# Patient Record
Sex: Female | Born: 1960 | Race: White | Hispanic: No | Smoking: Former smoker
Health system: Southern US, Community
[De-identification: ages and names within clinical notes are randomized; demographics above are authoritative.]

## PROBLEM LIST (undated history)

## (undated) DIAGNOSIS — E785 Hyperlipidemia, unspecified: Secondary | ICD-10-CM

## (undated) DIAGNOSIS — Z8719 Personal history of other diseases of the digestive system: Secondary | ICD-10-CM

## (undated) DIAGNOSIS — M51369 Other intervertebral disc degeneration, lumbar region without mention of lumbar back pain or lower extremity pain: Secondary | ICD-10-CM

## (undated) DIAGNOSIS — G629 Polyneuropathy, unspecified: Secondary | ICD-10-CM

## (undated) DIAGNOSIS — F32A Depression, unspecified: Secondary | ICD-10-CM

## (undated) DIAGNOSIS — R51 Headache: Secondary | ICD-10-CM

## (undated) DIAGNOSIS — I639 Cerebral infarction, unspecified: Secondary | ICD-10-CM

## (undated) DIAGNOSIS — K219 Gastro-esophageal reflux disease without esophagitis: Secondary | ICD-10-CM

## (undated) DIAGNOSIS — T783XXA Angioneurotic edema, initial encounter: Secondary | ICD-10-CM

## (undated) DIAGNOSIS — E162 Hypoglycemia, unspecified: Secondary | ICD-10-CM

## (undated) DIAGNOSIS — IMO0002 Reserved for concepts with insufficient information to code with codable children: Secondary | ICD-10-CM

## (undated) DIAGNOSIS — I714 Abdominal aortic aneurysm, without rupture, unspecified: Secondary | ICD-10-CM

## (undated) DIAGNOSIS — R519 Headache, unspecified: Secondary | ICD-10-CM

## (undated) DIAGNOSIS — M5136 Other intervertebral disc degeneration, lumbar region: Secondary | ICD-10-CM

## (undated) DIAGNOSIS — Z91018 Allergy to other foods: Secondary | ICD-10-CM

## (undated) DIAGNOSIS — F329 Major depressive disorder, single episode, unspecified: Secondary | ICD-10-CM

## (undated) HISTORY — DX: Other intervertebral disc degeneration, lumbar region: M51.36

## (undated) HISTORY — DX: Hypoglycemia, unspecified: E16.2

## (undated) HISTORY — DX: Other intervertebral disc degeneration, lumbar region without mention of lumbar back pain or lower extremity pain: M51.369

## (undated) HISTORY — PX: COLONOSCOPY: SHX174

## (undated) HISTORY — DX: Angioneurotic edema, initial encounter: T78.3XXA

## (undated) HISTORY — DX: Gastro-esophageal reflux disease without esophagitis: K21.9

## (undated) HISTORY — DX: Reserved for concepts with insufficient information to code with codable children: IMO0002

## (undated) HISTORY — DX: Headache, unspecified: R51.9

## (undated) HISTORY — DX: Hyperlipidemia, unspecified: E78.5

## (undated) HISTORY — DX: Major depressive disorder, single episode, unspecified: F32.9

## (undated) HISTORY — DX: Headache: R51

## (undated) HISTORY — DX: Depression, unspecified: F32.A

---

## 2002-01-05 ENCOUNTER — Emergency Department (HOSPITAL_COMMUNITY): Admission: EM | Admit: 2002-01-05 | Discharge: 2002-01-05 | Payer: Self-pay

## 2004-07-17 ENCOUNTER — Ambulatory Visit: Payer: Self-pay | Admitting: Internal Medicine

## 2004-08-18 ENCOUNTER — Ambulatory Visit: Payer: Self-pay | Admitting: Internal Medicine

## 2004-08-28 ENCOUNTER — Ambulatory Visit: Payer: Self-pay | Admitting: Internal Medicine

## 2004-09-29 ENCOUNTER — Other Ambulatory Visit: Admission: RE | Admit: 2004-09-29 | Discharge: 2004-09-29 | Payer: Self-pay | Admitting: Obstetrics and Gynecology

## 2005-03-08 ENCOUNTER — Ambulatory Visit: Payer: Self-pay | Admitting: Internal Medicine

## 2005-03-11 ENCOUNTER — Encounter: Admission: RE | Admit: 2005-03-11 | Discharge: 2005-03-11 | Payer: Self-pay | Admitting: Internal Medicine

## 2005-10-19 ENCOUNTER — Other Ambulatory Visit: Admission: RE | Admit: 2005-10-19 | Discharge: 2005-10-19 | Payer: Self-pay | Admitting: Obstetrics and Gynecology

## 2006-10-11 ENCOUNTER — Other Ambulatory Visit: Admission: RE | Admit: 2006-10-11 | Discharge: 2006-10-11 | Payer: Self-pay | Admitting: Obstetrics and Gynecology

## 2006-10-25 ENCOUNTER — Ambulatory Visit: Payer: Self-pay | Admitting: Internal Medicine

## 2007-04-05 ENCOUNTER — Ambulatory Visit: Payer: Self-pay | Admitting: Internal Medicine

## 2007-06-13 DIAGNOSIS — K219 Gastro-esophageal reflux disease without esophagitis: Secondary | ICD-10-CM | POA: Insufficient documentation

## 2007-06-13 DIAGNOSIS — M722 Plantar fascial fibromatosis: Secondary | ICD-10-CM | POA: Insufficient documentation

## 2007-08-13 ENCOUNTER — Emergency Department: Payer: Self-pay | Admitting: Emergency Medicine

## 2007-11-24 ENCOUNTER — Telehealth: Payer: Self-pay | Admitting: Internal Medicine

## 2008-01-26 ENCOUNTER — Emergency Department (HOSPITAL_COMMUNITY): Admission: EM | Admit: 2008-01-26 | Discharge: 2008-01-27 | Payer: Self-pay | Admitting: Emergency Medicine

## 2008-12-17 ENCOUNTER — Telehealth: Payer: Self-pay | Admitting: Internal Medicine

## 2009-07-30 ENCOUNTER — Encounter (INDEPENDENT_AMBULATORY_CARE_PROVIDER_SITE_OTHER): Payer: Self-pay | Admitting: *Deleted

## 2009-12-14 ENCOUNTER — Emergency Department (HOSPITAL_COMMUNITY): Admission: EM | Admit: 2009-12-14 | Discharge: 2009-12-14 | Payer: Self-pay | Admitting: Family Medicine

## 2010-01-27 ENCOUNTER — Emergency Department (HOSPITAL_COMMUNITY): Admission: EM | Admit: 2010-01-27 | Discharge: 2010-01-27 | Payer: Self-pay | Admitting: Emergency Medicine

## 2010-03-10 ENCOUNTER — Emergency Department (HOSPITAL_COMMUNITY): Admission: EM | Admit: 2010-03-10 | Discharge: 2010-03-10 | Payer: Self-pay | Admitting: Emergency Medicine

## 2010-03-23 ENCOUNTER — Telehealth: Payer: Self-pay | Admitting: Internal Medicine

## 2010-03-29 ENCOUNTER — Emergency Department: Payer: Self-pay | Admitting: Internal Medicine

## 2010-08-30 ENCOUNTER — Encounter: Payer: Self-pay | Admitting: Obstetrics and Gynecology

## 2010-09-02 ENCOUNTER — Ambulatory Visit: Admit: 2010-09-02 | Payer: Self-pay | Admitting: Obstetrics & Gynecology

## 2010-09-10 NOTE — Progress Notes (Signed)
Summary: REFILL   Phone Note Refill Request Call back at Home Phone [phone]   Refills Requested: Medication #1:  AMITRIPTYLINE HCL 25 MG  TABS Take 1 tablet by mouth once a day  Medication #2:  DICLOFENAC SODIUM 75 MG  TBEC 1 two times a day as needed. Summary of Call: Patient is requesting refill. She can not afford office visit at this time.  Initial call taken by: Lamar Sprinkles,  November 24, 2007 10:42 AM  Follow-up for Phone Call        OK to refill: #30, refill as needed  Follow-up by: Jacques Navy MD,  November 24, 2007 1:06 PM    New/Updated Medications: DICLOFENAC SODIUM 75 MG  TBEC (DICLOFENAC SODIUM) 1 two times a day as needed   Prescriptions: AMITRIPTYLINE HCL 25 MG  TABS (AMITRIPTYLINE HCL) Take 1 tablet by mouth once a day  #30 x PRN   Entered by:   Lamar Sprinkles   Authorized by:   Jacques Navy MD   Signed by:   Lamar Sprinkles on 11/24/2007   Method used:   Telephoned to ...       [facility]       [address]       [place], [place]  [address]       Ph: [phone]       Fax: [phone]   RxID:   [id number] DICLOFENAC SODIUM 75 MG  TBEC (DICLOFENAC SODIUM) 1 two times a day as needed  #60 x PRN   Entered by:   Lamar Sprinkles   Authorized by:   Jacques Navy MD   Signed by:   Lamar Sprinkles on 11/24/2007   Method used:   Telephoned to ...       [facility]       [address]       [place], [place]  [address]       Ph: [phone]       Fax: [phone]   RxID:   [id number]

## 2010-09-10 NOTE — Progress Notes (Signed)
Summary: Schedule Colonoscopy  Phone Note Outgoing Call Call back at [facility] Phone [phone]   Call placed by: Harlow Mares CMA Duncan Dull),  March 23, 2010 11:32 AM Call placed to: Patient Summary of Call: Left message on patients machine to call back. patient is due for a colonoscopy Initial call taken by: Harlow Mares CMA Duncan Dull),  March 23, 2010 11:33 AM  Follow-up for Phone Call        Left a message on the patient machine to call back and schedule a previsit and procedure with our office. A letter will be mailed to the patient.   Follow-up by: Harlow Mares CMA Duncan Dull),  March 30, 2010 9:21 AM

## 2010-09-10 NOTE — Progress Notes (Signed)
Summary: refill  Phone Note Call from Patient Call back at Home Phone [phone] Message from:  Patient on Dec 17, 2008 2:46 PM  Caller: Patient Reason for Call: Refill Medication Summary of Call: Patient called stating that per pharmacy refills have been  denied. patient requesting call back/ last seen 2008 Initial call taken by: Rock Nephew CMA,  Dec 17, 2008 2:48 PM  Follow-up for Phone Call        Patient is requesting refill's on diclofenac and amitriptyline. Last office visit 2008 Follow-up by: Lamar Sprinkles,  Dec 18, 2008 5:04 PM  Additional Follow-up for Phone Call Additional follow up Details #1::        We refilled in '09. No ov since '08. May refill for 2 months to give time for OV Additional Follow-up by: Jacques Navy MD,  Dec 18, 2008 5:15 PM    New/Updated Medications: AMITRIPTYLINE HCL 25 MG  TABS (AMITRIPTYLINE HCL) Take 1 tablet by mouth once a day NOTE: NEEDS office visit within next 2 mths!!!!!!!!!!!!!!!!! DICLOFENAC SODIUM 75 MG  TBEC (DICLOFENAC SODIUM) 1 two times a day as needed NEEDS office visit IN NEXT 2 MTHS !!  !!!!!! !! ! ! !   Prescriptions: DICLOFENAC SODIUM 75 MG  TBEC (DICLOFENAC SODIUM) 1 two times a day as needed NEEDS office visit IN NEXT 2 MTHS !!  !!!!!! !! ! ! !  #60 x 1   Entered by:   Lamar Sprinkles   Authorized by:   Jacques Navy MD   Signed by:   Lamar Sprinkles on 12/19/2008   Method used:   Electronically to        [facility]. [phone]* (retail)       [address]       [place], [place]  [address]       Ph: [phone]       Fax: [phone]   RxID:   [id number] AMITRIPTYLINE HCL 25 MG  TABS (AMITRIPTYLINE HCL) Take 1 tablet by mouth once a day NOTE: NEEDS office visit within next 2 mths!!!!!!!!!!!!!!!!!  #30 x 1   Entered by:   Lamar Sprinkles   Authorized by:   Jacques Navy MD   Signed by:   Lamar Sprinkles on 12/19/2008   Method used:   Electronically to        [facility]. [phone]* (retail)       [address]       [place], [place]  [address]       Ph: [phone]       Fax: [phone]   RxID:   [id number]

## 2010-09-10 NOTE — Letter (Signed)
Summary: Colonoscopy Letter  [facility]  [address] [place], [place] [address]   Phone: [phone]  Fax: [phone]      July 30, 2009 MRN: [id number]   [facility] [address] [place], [place]  [address]   Dear Ms. Kathy Schaefer,   According to your medical record, it is time for you to schedule a Colonoscopy. The American Cancer Society recommends this procedure as a method to detect early colon cancer. Patients with a family history of colon cancer, or a personal history of colon polyps or inflammatory bowel disease are at increased risk.  This letter has beeen generated based on the recommendations made at the time of your procedure. If you feel that in your particular situation this may no longer apply, please contact our office.  Please call our office at [phone] to schedule this appointment or to update your records at your earliest convenience.  Thank you for cooperating with [place] to provide you with the very best care possible.   Sincerely,  Iva Boop, M.D.  [facility] [place] [facility] [phone]

## 2010-10-23 LAB — URINALYSIS, ROUTINE W REFLEX MICROSCOPIC
Bilirubin Urine: NEGATIVE
Glucose, UA: NEGATIVE mg/dL
Ketones, ur: NEGATIVE mg/dL
Leukocytes, UA: NEGATIVE
Nitrite: NEGATIVE
Protein, ur: NEGATIVE mg/dL
Specific Gravity, Urine: 1.012 (ref 1.005–1.030)
Urobilinogen, UA: 0.2 mg/dL (ref 0.0–1.0)
pH: 5 (ref 5.0–8.0)

## 2010-10-23 LAB — POCT I-STAT, CHEM 8
BUN: 6 mg/dL (ref 6–23)
Calcium, Ion: 0.97 mmol/L — ABNORMAL LOW (ref 1.12–1.32)
Chloride: 111 mEq/L (ref 96–112)
Creatinine, Ser: 0.9 mg/dL (ref 0.4–1.2)
Glucose, Bld: 111 mg/dL — ABNORMAL HIGH (ref 70–99)
HCT: 46 % (ref 36.0–46.0)
Hemoglobin: 15.6 g/dL — ABNORMAL HIGH (ref 12.0–15.0)
Potassium: 4 mEq/L (ref 3.5–5.1)
Sodium: 141 mEq/L (ref 135–145)
TCO2: 21 mmol/L (ref 0–100)

## 2010-10-23 LAB — ETHANOL: Alcohol, Ethyl (B): 165 mg/dL — ABNORMAL HIGH (ref 0–10)

## 2010-10-23 LAB — CBC
HCT: 44 % (ref 36.0–46.0)
Hemoglobin: 15.2 g/dL — ABNORMAL HIGH (ref 12.0–15.0)
MCH: 32.6 pg (ref 26.0–34.0)
MCHC: 34.5 g/dL (ref 30.0–36.0)
MCV: 94.4 fL (ref 78.0–100.0)
Platelets: 246 10*3/uL (ref 150–400)
RBC: 4.66 MIL/uL (ref 3.87–5.11)
RDW: 12 % (ref 11.5–15.5)
WBC: 8.7 10*3/uL (ref 4.0–10.5)

## 2010-10-23 LAB — URINE MICROSCOPIC-ADD ON

## 2010-10-23 LAB — COMPREHENSIVE METABOLIC PANEL
ALT: 21 U/L (ref 0–35)
AST: 36 U/L (ref 0–37)
Albumin: 4.4 g/dL (ref 3.5–5.2)
Alkaline Phosphatase: 103 U/L (ref 39–117)
BUN: 7 mg/dL (ref 6–23)
CO2: 22 mEq/L (ref 19–32)
Calcium: 9 mg/dL (ref 8.4–10.5)
Chloride: 109 mEq/L (ref 96–112)
Creatinine, Ser: 0.71 mg/dL (ref 0.4–1.2)
GFR calc Af Amer: >60 mL/min (ref >60)
GFR calc non Af Amer: >60 mL/min (ref >60)
Glucose, Bld: 117 mg/dL — ABNORMAL HIGH (ref 70–99)
Potassium: 3.9 mEq/L (ref 3.5–5.1)
Sodium: 139 mEq/L (ref 135–145)
Total Bilirubin: 0.3 mg/dL (ref 0.3–1.2)
Total Protein: 7.5 g/dL (ref 6.0–8.3)

## 2010-10-23 LAB — APTT: aPTT: 22 seconds — ABNORMAL LOW (ref 24–37)

## 2010-10-23 LAB — PROTIME-INR
INR: 0.87 (ref 0.00–1.49)
Prothrombin Time: 11.8 seconds (ref 11.6–15.2)

## 2010-10-23 LAB — POCT PREGNANCY, URINE: Preg Test, Ur: NEGATIVE

## 2010-10-23 LAB — LACTIC ACID, PLASMA: Lactic Acid, Venous: 3.3 mmol/L — ABNORMAL HIGH (ref 0.5–2.2)

## 2010-10-25 LAB — BASIC METABOLIC PANEL
BUN: 12 mg/dL (ref 6–23)
CO2: 26 mEq/L (ref 19–32)
Calcium: 9 mg/dL (ref 8.4–10.5)
Chloride: 106 mEq/L (ref 96–112)
Creatinine, Ser: 0.73 mg/dL (ref 0.4–1.2)
GFR calc Af Amer: >60 mL/min (ref >60)
GFR calc non Af Amer: >60 mL/min (ref >60)
Glucose, Bld: 105 mg/dL — ABNORMAL HIGH (ref 70–99)
Potassium: 4.2 mEq/L (ref 3.5–5.1)
Sodium: 137 mEq/L (ref 135–145)

## 2010-10-25 LAB — CBC
HCT: 35.7 % — ABNORMAL LOW (ref 36.0–46.0)
Hemoglobin: 12.6 g/dL (ref 12.0–15.0)
MCHC: 35.2 g/dL (ref 30.0–36.0)
MCV: 95.8 fL (ref 78.0–100.0)
Platelets: 224 10*3/uL (ref 150–400)
RBC: 3.73 MIL/uL — ABNORMAL LOW (ref 3.87–5.11)
RDW: 12.5 % (ref 11.5–15.5)
WBC: 5.6 10*3/uL (ref 4.0–10.5)

## 2010-10-25 LAB — URINALYSIS, ROUTINE W REFLEX MICROSCOPIC
Bilirubin Urine: NEGATIVE
Glucose, UA: NEGATIVE mg/dL
Hgb urine dipstick: NEGATIVE
Ketones, ur: NEGATIVE mg/dL
Nitrite: NEGATIVE
Protein, ur: NEGATIVE mg/dL
Specific Gravity, Urine: 1.021 (ref 1.005–1.030)
Urobilinogen, UA: 0.2 mg/dL (ref 0.0–1.0)
pH: 5.5 (ref 5.0–8.0)

## 2010-10-25 LAB — DIFFERENTIAL
Basophils Absolute: 0 10*3/uL (ref 0.0–0.1)
Basophils Relative: 1 % (ref 0–1)
Eosinophils Absolute: 0.1 10*3/uL (ref 0.0–0.7)
Eosinophils Relative: 1 % (ref 0–5)
Lymphocytes Relative: 37 % (ref 12–46)
Lymphs Abs: 2.1 10*3/uL (ref 0.7–4.0)
Monocytes Absolute: 0.4 10*3/uL (ref 0.1–1.0)
Monocytes Relative: 7 % (ref 3–12)
Neutro Abs: 3 10*3/uL (ref 1.7–7.7)
Neutrophils Relative %: 54 % (ref 43–77)

## 2010-10-25 LAB — POCT CARDIAC MARKERS
CKMB, poc: <1 ng/mL — ABNORMAL LOW (ref 1.0–8.0)
Myoglobin, poc: 29.3 ng/mL (ref 12–200)
Troponin i, poc: <0.05 ng/mL (ref 0.00–0.09)

## 2010-10-25 LAB — POCT PREGNANCY, URINE: Preg Test, Ur: NEGATIVE

## 2010-11-14 ENCOUNTER — Emergency Department: Payer: Self-pay | Admitting: Emergency Medicine

## 2010-12-25 NOTE — Assessment & Plan Note (Signed)
[facility]                           PRIMARY CARE OFFICE NOTE   ZULEIKA, GALLUS                 MRN:          [id number]  DATE:10/26/2006                            DOB:          March 15, 1961    Ms. Kathy Schaefer is a 50 year old woman last seen in the office March 08, 2005, for sudden visual change.  Her evaluation at that time was  unremarkable with a negative MRI/MRA.  In the interval, she reports she  has been doing well.  She continues to use diclofenac on a daily basis.  She uses Nexium p.r.n. and is trying Prilosec at this time.  She reports  she does see her gynecologist, last visit being March 2008.  A mammogram  was scheduled.  She last had a colonoscopy in January 2006.   Past medical history is well documented, July 17, 2004, with no  significant change.  Family history remains the same.   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  She has a daughter who is out of school, a  daughter who is finishing her training in Financial controller.  The  patient does report she is in a relationship.   REVIEW OF SYSTEMS:  The patient has had no constitutional problems.  No  ENT, cardiovascular, respiratory, GI problems.   LIMITED EXAM:  VITAL SIGNS:  Temperature was 98.8, blood pressure  124/77, pulse 82, weight 203.  GENERAL APPEARANCE:  This is a heavy-set Caucasian female, looks her  stated age, in no acute distress.  HEENT:  Normocephalic, atraumatic.  EACs and TMs were normal.  Oropharynx revealed a full upper denture.  Native dentition on the  mandible.  No buccal lesions were noted.  Posterior pharynx was clear.  Conjunctivae and sclerae was clear.  PERRLA.  EOMI.  Funduscopic exam  was unremarkable.  NECK:  Supple without thyromegaly.  NODES:  No adenopathy was noted in the cervical, supraclavicular  regions.  CHEST:  No CVA tenderness.  LUNGS:  Clear to auscultation and  percussion.  BREAST EXAM:  Deferred.  ABDOMEN:  Soft, no guarding or rebound.  No organ or splenomegaly was  noted.  PELVIC/RECTAL:  Deferred.  EXTREMITIES:  Without cyanosis, clubbing, edema, or deformity.  She has  full range of motion of her neck with no limitation.   ASSESSMENT/PLAN:  1. Degenerative joint disease.  The patient with ongoing neck pain and      discomfort for which she takes Voltaren with good result.  Refill      prescriptions provided.  2. Gastroesophageal reflux disease.  The patient has intermittent      gastroesophageal reflux disease, currently using Prilosec with good      results.  I have advised her to increase the dose from 20 to 40 mg      daily for comparable effects of Nexium.  3. Health maintenance.  The patient is current and up to date.   The patient is asked to return to see me in 12 to 18 months for routine  followup.  I have asked her to provide me  with copies of labs that were  done when she had a [facility] physical.     Rosalyn Gess. Norins, MD  Electronically Signed    MEN/MedQ  DD: 10/26/2006  DT: 10/26/2006  Job #: [id number]   cc:   [address] [place] [address] [place], [phone] [place]

## 2011-11-01 ENCOUNTER — Encounter: Payer: Self-pay | Admitting: Internal Medicine

## 2013-03-27 ENCOUNTER — Ambulatory Visit: Payer: Self-pay | Attending: Internal Medicine

## 2013-03-27 ENCOUNTER — Ambulatory Visit: Payer: No Typology Code available for payment source | Attending: Internal Medicine | Admitting: Internal Medicine

## 2013-03-27 VITALS — BP 129/82 | HR 99 | Temp 98.3°F | Resp 16 | Wt 234.0 lb

## 2013-03-27 DIAGNOSIS — F32A Depression, unspecified: Secondary | ICD-10-CM

## 2013-03-27 DIAGNOSIS — F3289 Other specified depressive episodes: Secondary | ICD-10-CM

## 2013-03-27 DIAGNOSIS — F329 Major depressive disorder, single episode, unspecified: Secondary | ICD-10-CM | POA: Insufficient documentation

## 2013-03-27 DIAGNOSIS — M1990 Unspecified osteoarthritis, unspecified site: Secondary | ICD-10-CM

## 2013-03-27 DIAGNOSIS — E785 Hyperlipidemia, unspecified: Secondary | ICD-10-CM

## 2013-03-27 LAB — CBC WITH DIFFERENTIAL/PLATELET
Basophils Absolute: 0 10*3/uL (ref 0.0–0.1)
Basophils Relative: 0 % (ref 0–1)
Eosinophils Absolute: 0.1 10*3/uL (ref 0.0–0.7)
Eosinophils Relative: 2 % (ref 0–5)
HCT: 39.1 % (ref 36.0–46.0)
Hemoglobin: 13.4 g/dL (ref 12.0–15.0)
Lymphocytes Relative: 42 % (ref 12–46)
Lymphs Abs: 2.2 10*3/uL (ref 0.7–4.0)
MCH: 30.2 pg (ref 26.0–34.0)
MCHC: 34.3 g/dL (ref 30.0–36.0)
MCV: 88.1 fL (ref 78.0–100.0)
Monocytes Absolute: 0.4 10*3/uL (ref 0.1–1.0)
Monocytes Relative: 8 % (ref 3–12)
Neutro Abs: 2.6 10*3/uL (ref 1.7–7.7)
Neutrophils Relative %: 48 % (ref 43–77)
Platelets: 250 10*3/uL (ref 150–400)
RBC: 4.44 MIL/uL (ref 3.87–5.11)
RDW: 13 % (ref 11.5–15.5)
WBC: 5.4 10*3/uL (ref 4.0–10.5)

## 2013-03-27 MED ORDER — AMITRIPTYLINE HCL 10 MG PO TABS: 10.0000 mg | ORAL_TABLET | Freq: Every day | ORAL | Status: DC

## 2013-03-27 MED ORDER — TRAMADOL HCL 50 MG PO TABS: 50.0000 mg | ORAL_TABLET | Freq: Three times a day (TID) | ORAL | Status: DC | PRN

## 2013-03-27 NOTE — Progress Notes (Signed)
Patient ID: Kathy Schaefer, female   DOB: [birth year], 52 y.o.   MRN: [id number]  CC: To establish care  HPI: Patient is a 53 years old woman presented to our clinic today to establish medical care. She complains of generalized joint pain, she claims she was diagnosed with osteoarthritis and depression, has been on diclofenac for pain for so long that is it starts to affect her kidney according to the previous doctor. She also claimed to be taking over-the-counter ibuprofen, and has been advised never to take his medications again because of her kidney. The pain persisted however, she has not had MRI of her neck of the lumbar spine. She does not have any weakness, no urinary or fecal incontinence, no seizure. She has no history of hypertension or diabetes. She's not on any chronic medication is a for pain. She claims she is depressed at the moment, but denies any suicidal ideation or thoughts, she also declined been prescribed medication. She denied any injury. She says she has been worked up for rheumatoid arthritis and other autoimmune disease, all results were negative according to patient.  Allergies  Allergen Reactions  . Cyclobenzaprine Hcl     REACTION: Headache  . Guaifenesin   . Phenylpropanolamine Hcl    Past Medical History  Diagnosis Date  . Head ache   . Degenerative disc disease    Current Outpatient Prescriptions on File Prior to Visit  Medication Sig Dispense Refill  . amitriptyline (ELAVIL) 10 MG tablet Take 10 mg by mouth at bedtime.      . diclofenac (VOLTAREN) 75 MG EC tablet Take 75 mg by mouth 2 (two) times daily.       No current facility-administered medications on file prior to visit.   History reviewed. No pertinent family history. History   Social History  . Marital Status: Legally Separated    Spouse Name: N/A    Number of Children: N/A  . Years of Education: N/A   Occupational History  . Not on file.   Social History Main Topics  . Smoking status: Not  on file  . Smokeless tobacco: Not on file  . Alcohol Use:   . Drug Use:   . Sexual Activity:    Other Topics Concern  . Not on file   Social History Narrative  . No narrative on file    Review of Systems: Constitutional: Negative for fever, chills, diaphoresis, activity change, appetite change and fatigue. HENT: Negative for ear pain, nosebleeds, congestion, facial swelling, rhinorrhea, neck pain, neck stiffness and ear discharge.  Eyes: Negative for pain, discharge, redness, itching and visual disturbance. Respiratory: Negative for cough, choking, chest tightness, shortness of breath, wheezing and stridor.  Cardiovascular: Negative for chest pain, palpitations and leg swelling. Gastrointestinal: Negative for abdominal distention. Genitourinary: Negative for dysuria, urgency, frequency, hematuria, flank pain, decreased urine volume, difficulty urinating and dyspareunia.  Musculoskeletal: Genet=ralized joint pains Neurological: Negative for dizziness, tremors, seizures, syncope, facial asymmetry, speech difficulty, weakness, light-headedness, numbness and headaches.  Hematological: Negative for adenopathy. Does not bruise/bleed easily. Psychiatric/Behavioral: Negative for hallucinations, behavioral problems, confusion, dysphoric mood, decreased concentration and agitation.    Objective:   Filed Vitals:   03/27/13 1532  BP: 129/82  Pulse: 99  Temp: 98.3 F (36.8 C)  Resp: 16    Physical Exam: Constitutional: Patient appears well-developed and well-nourished. No distress. HENT: Normocephalic, atraumatic, External right and left ear normal. Oropharynx is clear and moist.  Eyes: Conjunctivae and EOM are normal. PERRLA, no scleral  icterus. Neck: Point tenderness at the cervical spine CVS: RRR, S1/S2 +, no murmurs, no gallops, no carotid bruit.  Pulmonary: Effort and breath sounds normal, no stridor, rhonchi, wheezes, rales.  Abdominal: Soft. BS +,  no distension, tenderness,  rebound or guarding.  Musculoskeletal: Normal range of motion. No edema and no tenderness.  Lymphadenopathy: No lymphadenopathy noted, cervical, inguinal or axillary Neuro: Alert. Normal reflexes, muscle tone coordination. No cranial nerve deficit. Skin: Skin is warm and dry. No rash noted. Not diaphoretic. No erythema. No pallor. Psychiatric: Normal mood and affect. Behavior, judgment, thought content normal.  Lab Results  Component Value Date   WBC 8.7 03/10/2010   HGB 15.6* 03/10/2010   HCT 46.0 03/10/2010   MCV 94.4 03/10/2010   PLT 246 03/10/2010   Lab Results  Component Value Date   CREATININE 0.9 03/10/2010   BUN 6 03/10/2010   NA 141 03/10/2010   K 4.0 03/10/2010   CL 111 03/10/2010   CO2 22 03/10/2010    No results found for this basename: HGBA1C   Lipid Panel  No results found for this basename: chol, trig, hdl, cholhdl, vldl, ldlcalc       Assessment and plan:   Patient Active Problem List   Diagnosis Date Noted  . Osteoarthritis 03/27/2013  . Depression (emotion) 03/27/2013  . GERD 06/13/2007  . 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  Jolee Ewing verbalized understanding.  HAYDYN LIDDELL was told to call to get lab results if hasn't heard anything in the next week.        Jeanann Lewandowsky, MD [facility] [place], [place] [phone]   03/27/2013, 4:20 PM

## 2013-03-27 NOTE — Progress Notes (Signed)
Patient here to establish care Was diagnosed with spondylosis Aches all over

## 2013-03-28 ENCOUNTER — Encounter: Payer: Self-pay | Admitting: Internal Medicine

## 2013-03-28 LAB — LIPID PANEL
Cholesterol: 306 mg/dL — ABNORMAL HIGH (ref 0–200)
HDL: 37 mg/dL — ABNORMAL LOW (ref >39)
Total CHOL/HDL Ratio: 8.3 Ratio
Triglycerides: 514 mg/dL — ABNORMAL HIGH (ref <150)

## 2013-03-28 LAB — COMPLETE METABOLIC PANEL WITH GFR
ALT: 9 U/L (ref 0–35)
AST: 15 U/L (ref 0–37)
Albumin: 4.3 g/dL (ref 3.5–5.2)
Alkaline Phosphatase: 135 U/L — ABNORMAL HIGH (ref 39–117)
BUN: 15 mg/dL (ref 6–23)
CO2: 33 mEq/L — ABNORMAL HIGH (ref 19–32)
Calcium: 9.8 mg/dL (ref 8.4–10.5)
Chloride: 101 mEq/L (ref 96–112)
Creat: 0.71 mg/dL (ref 0.50–1.10)
GFR, Est African American: >89 mL/min
GFR, Est Non African American: >89 mL/min
Glucose, Bld: 88 mg/dL (ref 70–99)
Potassium: 4.4 mEq/L (ref 3.5–5.3)
Sodium: 141 mEq/L (ref 135–145)
Total Bilirubin: 0.3 mg/dL (ref 0.3–1.2)
Total Protein: 6.9 g/dL (ref 6.0–8.3)

## 2013-03-28 LAB — CK: Total CK: 73 U/L (ref 7–177)

## 2013-03-28 NOTE — Progress Notes (Unsigned)
CSW met with patient in order to provide emotional support.  Patient stated that she has been dealing with chronic pain and has been very tearful due to intensity of pain and lack of independent lifestyle that she was used to.  CSW encouraged patient to connect to local community supports to gain resources.  CSW provided patient with some reframing to relieve low mood state.  Patient was not willing to identify depression as a diagnosis.  CSW identified better understanding that patient's mood state is connected to her how she feels and her recent lifestyle change, but monitoring is important for ongoing care and management to ensure mood does not worsen.  CSW will continue to follow as necessary. Beverly Sessions MSW, LCSW [phone] Duration - 45 min

## 2013-03-29 LAB — TSH+FREE T4
Free T4: 1.12 ng/dL (ref 0.82–1.77)
TSH: 0.922 u[IU]/mL (ref 0.450–4.500)

## 2013-04-02 ENCOUNTER — Other Ambulatory Visit (HOSPITAL_COMMUNITY): Payer: Self-pay

## 2013-04-03 ENCOUNTER — Ambulatory Visit
Admission: RE | Admit: 2013-04-03 | Discharge: 2013-04-03 | Disposition: A | Discharge status: Home / Self Care | Payer: No Typology Code available for payment source | Source: Ambulatory Visit | Attending: Internal Medicine | Admitting: Internal Medicine

## 2013-04-03 ENCOUNTER — Other Ambulatory Visit: Payer: Self-pay | Admitting: Internal Medicine

## 2013-04-03 DIAGNOSIS — M1990 Unspecified osteoarthritis, unspecified site: Secondary | ICD-10-CM

## 2013-04-04 ENCOUNTER — Ambulatory Visit: Payer: Self-pay

## 2013-04-06 ENCOUNTER — Telehealth: Payer: Self-pay | Admitting: Emergency Medicine

## 2013-04-06 NOTE — Telephone Encounter (Signed)
Message copied by Darlis Loan on Fri Apr 06, 2013  4:46 PM ------      Message from: Jeanann Lewandowsky E      Created: Wed Apr 04, 2013  5:10 PM       Please let patient know that based on her MRI reports, she will need to be seen by neurosurgery. We can schedule a referral for her to see neurosurgery if it has not been done before. Please call to make appointment for this referral ------

## 2013-04-06 NOTE — Telephone Encounter (Signed)
Left a message with pt to schedule appt for neurology referral

## 2013-04-12 ENCOUNTER — Ambulatory Visit: Payer: No Typology Code available for payment source | Attending: Internal Medicine | Admitting: Internal Medicine

## 2013-04-12 ENCOUNTER — Encounter: Payer: Self-pay | Admitting: Internal Medicine

## 2013-04-12 VITALS — BP 126/80 | HR 81 | Temp 98.7°F | Resp 16 | Ht 66.93 in | Wt 231.0 lb

## 2013-04-12 DIAGNOSIS — M1990 Unspecified osteoarthritis, unspecified site: Secondary | ICD-10-CM

## 2013-04-12 DIAGNOSIS — E781 Pure hyperglyceridemia: Secondary | ICD-10-CM

## 2013-04-12 DIAGNOSIS — M549 Dorsalgia, unspecified: Secondary | ICD-10-CM | POA: Insufficient documentation

## 2013-04-12 DIAGNOSIS — E785 Hyperlipidemia, unspecified: Secondary | ICD-10-CM | POA: Insufficient documentation

## 2013-04-12 MED ORDER — FENOFIBRATE 145 MG PO TABS: 145.0000 mg | ORAL_TABLET | Freq: Every day | ORAL | Status: DC

## 2013-04-12 MED ORDER — AMITRIPTYLINE HCL 25 MG PO TABS: 25.0000 mg | ORAL_TABLET | Freq: Every day | ORAL | Status: DC

## 2013-04-12 MED ORDER — TRAMADOL HCL 50 MG PO TABS: 50.0000 mg | ORAL_TABLET | Freq: Three times a day (TID) | ORAL | Status: DC | PRN

## 2013-04-12 NOTE — Progress Notes (Signed)
F/U pt is here for MRI and lab results.

## 2013-04-12 NOTE — Progress Notes (Signed)
Patient ID: Kathy Schaefer, female   DOB: 1961/05/30, 52 y.o.   MRN: [id number]   History of present illness 52 year old female who was seen recently in the clinic for symptoms of ongoing back pain and pain over almost every joint in the body had an MRI of the cervical and lumbar spine done pediatrician here for followup of results. MRI of the cervical spine showed disc protrusions with bilateral foraminal stenosis. Mild the lumbar spine showed severe to verify pacer degeneration with some marrow edema without nerve impingement . No Spinal stenosis noted. She reports ongoing pain. Denies any tingling or numbness or muscle weakness. Denies any bowel or urinary symptoms. Denies chest pain, palpitations, shortness of breath, headache, blurred vision, dizziness, fever, chills, bowel or urinary symptoms.  Objective:  Vital signs in last 24 hours:  Filed Vitals:   04/12/13 1807  BP: 126/80  Pulse: 81  Temp: 98.7 F (37.1 C)  TempSrc: Oral  Resp: 16  Height: 5' 6.93" (1.7 m)  Weight: 231 lb (104.781 kg)  SpO2: 99%     Physical Exam:  General: Middle aged female in no acute distress. HEENT: no pallor, no icterus, moist oral mucosa,  Heart: Normal  s1 &s2  Regular rate and rhythm,  Lungs: Clear to auscultation bilaterally. Abdomen: Soft, nontender, nondistended, positive bowel sounds. Extremities: No back tenderness, SLT negative, normal range of motion of the joints, No clubbing cyanosis or edema with positive pedal pulses. Neuro: Alert, awake, oriented x3, nonfocal.   Lab Results:  Basic Metabolic Panel:    Component Value Date/Time   NA 141 03/27/2013 1623   K 4.4 03/27/2013 1623   CL 101 03/27/2013 1623   CO2 33* 03/27/2013 1623   BUN 15 03/27/2013 1623   CREATININE 0.71 03/27/2013 1623   CREATININE 0.9 03/10/2010 0151   GLUCOSE 88 03/27/2013 1623   CALCIUM 9.8 03/27/2013 1623   CBC:    Component Value Date/Time   WBC 5.4 03/27/2013 1623   HGB 13.4 03/27/2013 1623   HCT 39.1  03/27/2013 1623   PLT 250 03/27/2013 1623   MCV 88.1 03/27/2013 1623   NEUTROABS 2.6 03/27/2013 1623   LYMPHSABS 2.2 03/27/2013 1623   MONOABS 0.4 03/27/2013 1623   EOSABS 0.1 03/27/2013 1623   BASOSABS 0.0 03/27/2013 1623    No results found for this or any previous visit (from the past 240 hour(s)).  Studies/Results: No results found.  Medications: Scheduled Meds: Continuous Infusions: PRN Meds:.    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 No nerve impingement. Patient has ongoing back pain. I would continue her on tramadol when necessary and amitriptyline at bedtime. We'll increase the amitriptyline as it seems to help with her symptoms. Watercooled neurosurgery.  Dyslipidemia with hypertriglyceridemia Will start her on tricor 145 mg daily   Kathy Schaefer 04/12/2013, 6:50 PM

## 2013-04-24 ENCOUNTER — Telehealth: Payer: Self-pay | Admitting: Emergency Medicine

## 2013-04-24 NOTE — Telephone Encounter (Signed)
Spoke with Dr. Hyman Hopes regarding pt concerns of taking prescribed Diclofenac 75 mg for degenerative disease. Pt states she was told per to stop taking med due to kidney damage. Pt to continue taking medication until seen by orthopedic per Dr. Hyman Hopes.

## 2013-04-26 ENCOUNTER — Other Ambulatory Visit: Payer: Self-pay | Admitting: Emergency Medicine

## 2013-04-26 MED ORDER — DICLOFENAC SODIUM 75 MG PO TBEC: 75.0000 mg | DELAYED_RELEASE_TABLET | Freq: Two times a day (BID) | ORAL | Status: DC

## 2013-05-03 ENCOUNTER — Ambulatory Visit: Payer: Self-pay

## 2013-05-18 ENCOUNTER — Other Ambulatory Visit: Payer: Self-pay | Admitting: Emergency Medicine

## 2013-05-18 ENCOUNTER — Telehealth: Payer: Self-pay | Admitting: Emergency Medicine

## 2013-05-18 ENCOUNTER — Other Ambulatory Visit: Payer: Self-pay | Admitting: Internal Medicine

## 2013-05-18 MED ORDER — AMITRIPTYLINE HCL 25 MG PO TABS: 25.0000 mg | ORAL_TABLET | Freq: Every day | ORAL | Status: DC

## 2013-07-18 ENCOUNTER — Other Ambulatory Visit: Payer: Self-pay | Admitting: Internal Medicine

## 2013-08-07 ENCOUNTER — Other Ambulatory Visit: Payer: Self-pay | Admitting: Internal Medicine

## 2013-08-08 ENCOUNTER — Telehealth: Payer: Self-pay | Admitting: Emergency Medicine

## 2013-08-08 ENCOUNTER — Other Ambulatory Visit: Payer: Self-pay | Admitting: Emergency Medicine

## 2013-08-08 MED ORDER — DICLOFENAC SODIUM 75 MG PO TBEC: 75.0000 mg | DELAYED_RELEASE_TABLET | Freq: Two times a day (BID) | ORAL | Status: DC

## 2013-08-08 NOTE — Telephone Encounter (Signed)
Pt called in requesting refill Diclofenac 75 mg tab Pt informed this will be the last time we fill medication without office visit #30 day supply given,no refill

## 2013-08-17 ENCOUNTER — Encounter: Payer: Self-pay | Admitting: Internal Medicine

## 2013-08-17 ENCOUNTER — Ambulatory Visit: Payer: Self-pay | Attending: Internal Medicine | Admitting: Internal Medicine

## 2013-08-17 VITALS — BP 130/84 | HR 70 | Temp 98.5°F | Resp 16 | Wt 244.0 lb

## 2013-08-17 DIAGNOSIS — M1990 Unspecified osteoarthritis, unspecified site: Secondary | ICD-10-CM

## 2013-08-17 MED ORDER — DICLOFENAC SODIUM 75 MG PO TBEC
75.0000 mg | DELAYED_RELEASE_TABLET | Freq: Two times a day (BID) | ORAL | Status: DC
Start: 2013-08-17 — End: 2013-11-15

## 2013-08-17 MED ORDER — TRAMADOL HCL 50 MG PO TABS: 50.0000 mg | ORAL_TABLET | Freq: Three times a day (TID) | ORAL | Status: DC | PRN

## 2013-08-17 MED ORDER — AMITRIPTYLINE HCL 25 MG PO TABS: ORAL_TABLET | ORAL | Status: DC

## 2013-08-17 NOTE — Progress Notes (Signed)
Patient is here for medication refill Also would like to discuss  Her MRI of her spine results In confused as what she was told- not sure if she should be going To orthopedic surgeon or neuro surgeon

## 2013-08-17 NOTE — Progress Notes (Signed)
MRN: [id number] Name: Kathy Schaefer  Sex: female Age: 53 y.o. DOB: 02/29/1961  Allergies: Cyclobenzaprine hcl; Guaifenesin; and Phenylpropanolamine hcl  Chief Complaint  Patient presents with  . Back Pain    HPI: Patient is 53 y.o. female who comes today and is requesting refill on her medication history of chronic neck pain low back pain history of past arthritis, had MRI cervical and lower lumbar done in August and was recommended to follow with neurosurgeon. Denies any worsening of the symptoms denies any fever chills. She takes Voltaren for the pain and tramadol for severe pain when necessary.  Past Medical History  Diagnosis Date  . Head ache   . Degenerative disc disease   . GERD (gastroesophageal reflux disease)   . Depression     History reviewed. No pertinent past surgical history.    Medication List       This list is accurate as of: 08/17/13  4:06 PM.  Always use your most recent med list.               amitriptyline 25 MG tablet  Commonly known as:  ELAVIL  TAKE ONE TABLET BY MOUTH AT BEDTIME     diclofenac 75 MG EC tablet  Commonly known as:  VOLTAREN  Take 1 tablet (75 mg total) by mouth 2 (two) times daily.     fenofibrate 145 MG tablet  Commonly known as:  TRICOR  Take 1 tablet (145 mg total) by mouth daily.     traMADol 50 MG tablet  Commonly known as:  ULTRAM  Take 1 tablet (50 mg total) by mouth every 8 (eight) hours as needed.        Meds ordered this encounter  Medications  . amitriptyline (ELAVIL) 25 MG tablet    Sig: TAKE ONE TABLET BY MOUTH AT BEDTIME    Dispense:  30 tablet    Refill:  3  . diclofenac (VOLTAREN) 75 MG EC tablet    Sig: Take 1 tablet (75 mg total) by mouth 2 (two) times daily.    Dispense:  60 tablet    Refill:  3  . traMADol (ULTRAM) 50 MG tablet    Sig: Take 1 tablet (50 mg total) by mouth every 8 (eight) hours as needed.    Dispense:  30 tablet    Refill:  0     There is no immunization history on file for  this patient.  History reviewed. No pertinent family history.  History  Substance Use Topics  . Smoking status: Never Smoker   . Smokeless tobacco: Not on file  . Alcohol Use: 0.5 oz/week    1 drink(s) per week    Review of Systems  As noted in HPI  Filed Vitals:   08/17/13 1444  BP: 130/84  Pulse: 70  Temp: 98.5 F (36.9 C)  Resp: 16    Physical Exam  Physical Exam  Cardiovascular: Normal rate and regular rhythm.   Pulmonary/Chest: Breath sounds normal. No respiratory distress. She has no wheezes. She has no rales.  Musculoskeletal:  Minimal lower lumbar paraspinal tenderness, with SLR test patient complaining of back discomfort denies any shooting pain down to the leg, equal strength all extremities.  Neurological: She has normal reflexes.    CBC    Component Value Date/Time   WBC 5.4 03/27/2013 1623   RBC 4.44 03/27/2013 1623   HGB 13.4 03/27/2013 1623   HCT 39.1 03/27/2013 1623   PLT 250 03/27/2013 1623   MCV  88.1 03/27/2013 1623   LYMPHSABS 2.2 03/27/2013 1623   MONOABS 0.4 03/27/2013 1623   EOSABS 0.1 03/27/2013 1623   BASOSABS 0.0 03/27/2013 1623    CMP     Component Value Date/Time   NA 141 03/27/2013 1623   K 4.4 03/27/2013 1623   CL 101 03/27/2013 1623   CO2 33* 03/27/2013 1623   GLUCOSE 88 03/27/2013 1623   BUN 15 03/27/2013 1623   CREATININE 0.71 03/27/2013 1623   CREATININE 0.9 03/10/2010 0151   CALCIUM 9.8 03/27/2013 1623   PROT 6.9 03/27/2013 1623   ALBUMIN 4.3 03/27/2013 1623   AST 15 03/27/2013 1623   ALT 9 03/27/2013 1623   ALKPHOS 135* 03/27/2013 1623   BILITOT 0.3 03/27/2013 1623   GFRNONAA >60 03/10/2010 0142   GFRAA  Value: >60        The eGFR has been calculated using the MDRD equation. This calculation has not been validated in all clinical situations. eGFR's persistently <60 mL/min signify possible Chronic Kidney Disease. 03/10/2010 0142    Lab Results  Component Value Date/Time   CHOL 306* 03/27/2013  4:23 PM    No components found with this  basename: hga1c    Lab Results  Component Value Date/Time   AST 15 03/27/2013  4:23 PM    Assessment and Plan  Osteoarthritis - Plan: Patient was given refill on the medications diclofenac (VOLTAREN) 75 MG EC tablet, traMADol (ULTRAM) 50 MG tablet, also advise to make appointment with neurosurgery.   Return in about 3 months (around 11/15/2013).  Lorayne Marek, MD

## 2013-08-20 ENCOUNTER — Telehealth: Payer: Self-pay | Admitting: Internal Medicine

## 2013-08-20 NOTE — Telephone Encounter (Signed)
Left message for pt to call clinic when message received 

## 2013-08-20 NOTE — Telephone Encounter (Signed)
Pt called in to day to check on the status of her refills for scripts diclofenac (VOLTAREN) 75 MG EC tablet and amitriptyline (ELAVIL) 25 MG tablet; pt was informed that she still has refills and to call her pharmacy to check the readiness;

## 2013-09-13 ENCOUNTER — Ambulatory Visit: Payer: Self-pay | Admitting: Internal Medicine

## 2013-11-15 ENCOUNTER — Encounter: Payer: Self-pay | Admitting: Internal Medicine

## 2013-11-15 ENCOUNTER — Ambulatory Visit: Payer: No Typology Code available for payment source | Attending: Internal Medicine | Admitting: Internal Medicine

## 2013-11-15 VITALS — BP 136/84 | HR 84 | Temp 98.2°F | Resp 18 | Wt 242.8 lb

## 2013-11-15 DIAGNOSIS — M1990 Unspecified osteoarthritis, unspecified site: Secondary | ICD-10-CM

## 2013-11-15 DIAGNOSIS — Z139 Encounter for screening, unspecified: Secondary | ICD-10-CM

## 2013-11-15 DIAGNOSIS — E781 Pure hyperglyceridemia: Secondary | ICD-10-CM

## 2013-11-15 DIAGNOSIS — IMO0002 Reserved for concepts with insufficient information to code with codable children: Secondary | ICD-10-CM

## 2013-11-15 LAB — TSH: TSH: 1.027 u[IU]/mL (ref 0.350–4.500)

## 2013-11-15 MED ORDER — FENOFIBRATE 145 MG PO TABS: 145.0000 mg | ORAL_TABLET | Freq: Every day | ORAL | Status: DC

## 2013-11-15 MED ORDER — TRAMADOL HCL 50 MG PO TABS: 50.0000 mg | ORAL_TABLET | Freq: Three times a day (TID) | ORAL | Status: DC | PRN

## 2013-11-15 MED ORDER — DICLOFENAC SODIUM 75 MG PO TBEC: 75.0000 mg | DELAYED_RELEASE_TABLET | Freq: Two times a day (BID) | ORAL | Status: DC

## 2013-11-15 MED ORDER — AMITRIPTYLINE HCL 25 MG PO TABS: ORAL_TABLET | ORAL | Status: DC

## 2013-11-15 NOTE — Patient Instructions (Signed)
Fat and Cholesterol Control Diet  Fat and cholesterol levels in your blood and organs are influenced by your diet. High levels of fat and cholesterol may lead to diseases of the heart, small and large blood vessels, gallbladder, liver, and pancreas.  CONTROLLING FAT AND CHOLESTEROL WITH DIET  Although exercise and lifestyle factors are important, your diet is key. That is because certain foods are known to raise cholesterol and others to lower it. The goal is to balance foods for their effect on cholesterol and more importantly, to replace saturated and trans fat with other types of fat, such as monounsaturated fat, polyunsaturated fat, and omega-3 fatty acids.  On average, a person should consume no more than 15 to 17 g of saturated fat daily. Saturated and trans fats are considered "bad" fats, and they will raise LDL cholesterol. Saturated fats are primarily found in animal products such as meats, butter, and cream. However, that does not mean you need to give up all your favorite foods. Today, there are good tasting, low-fat, low-cholesterol substitutes for most of the things you like to eat. Choose low-fat or nonfat alternatives. Choose round or loin cuts of red meat. These types of cuts are lowest in fat and cholesterol. Chicken (without the skin), fish, veal, and ground turkey breast are great choices. Eliminate fatty meats, such as hot dogs and salami. Even shellfish have little or no saturated fat. Have a 3 oz (85 g) portion when you eat lean meat, poultry, or fish.  Trans fats are also called "partially hydrogenated oils." They are oils that have been scientifically manipulated so that they are solid at room temperature resulting in a longer shelf life and improved taste and texture of foods in which they are added. Trans fats are found in stick margarine, some tub margarines, cookies, crackers, and baked goods.   When baking and cooking, oils are a great substitute for butter. The monounsaturated oils are  especially beneficial since it is believed they lower LDL and raise HDL. The oils you should avoid entirely are saturated tropical oils, such as coconut and palm.   Remember to eat a lot from food groups that are naturally free of saturated and trans fat, including fish, fruit, vegetables, beans, grains (barley, rice, couscous, bulgur wheat), and pasta (without cream sauces).   IDENTIFYING FOODS THAT LOWER FAT AND CHOLESTEROL   Soluble fiber may lower your cholesterol. This type of fiber is found in fruits such as apples, vegetables such as broccoli, potatoes, and carrots, legumes such as beans, peas, and lentils, and grains such as barley. Foods fortified with plant sterols (phytosterol) may also lower cholesterol. You should eat at least 2 g per day of these foods for a cholesterol lowering effect.   Read package labels to identify low-saturated fats, trans fat free, and low-fat foods at the supermarket. Select cheeses that have only 2 to 3 g saturated fat per ounce. Use a heart-healthy tub margarine that is free of trans fats or partially hydrogenated oil. When buying baked goods (cookies, crackers), avoid partially hydrogenated oils. Breads and muffins should be made from whole grains (whole-wheat or whole oat flour, instead of "flour" or "enriched flour"). Buy non-creamy canned soups with reduced salt and no added fats.   FOOD PREPARATION TECHNIQUES   Never deep-fry. If you must fry, either stir-fry, which uses very little fat, or use non-stick cooking sprays. When possible, broil, bake, or roast meats, and steam vegetables. Instead of putting butter or margarine on vegetables, use lemon   and herbs, applesauce, and cinnamon (for squash and sweet potatoes). Use nonfat yogurt, salsa, and low-fat dressings for salads.   LOW-SATURATED FAT / LOW-FAT FOOD SUBSTITUTES  Meats / Saturated Fat (g)  · Avoid: Steak, marbled (3 oz/85 g) / 11 g  · Choose: Steak, lean (3 oz/85 g) / 4 g  · Avoid: Hamburger (3 oz/85 g) / 7  g  · Choose: Hamburger, lean (3 oz/85 g) / 5 g  · Avoid: Ham (3 oz/85 g) / 6 g  · Choose: Ham, lean cut (3 oz/85 g) / 2.4 g  · Avoid: Chicken, with skin, dark meat (3 oz/85 g) / 4 g  · Choose: Chicken, skin removed, dark meat (3 oz/85 g) / 2 g  · Avoid: Chicken, with skin, light meat (3 oz/85 g) / 2.5 g  · Choose: Chicken, skin removed, light meat (3 oz/85 g) / 1 g  Dairy / Saturated Fat (g)  · Avoid: Whole milk (1 cup) / 5 g  · Choose: Low-fat milk, 2% (1 cup) / 3 g  · Choose: Low-fat milk, 1% (1 cup) / 1.5 g  · Choose: Skim milk (1 cup) / 0.3 g  · Avoid: Hard cheese (1 oz/28 g) / 6 g  · Choose: Skim milk cheese (1 oz/28 g) / 2 to 3 g  · Avoid: Cottage cheese, 4% fat (1 cup) / 6.5 g  · Choose: Low-fat cottage cheese, 1% fat (1 cup) / 1.5 g  · Avoid: Ice cream (1 cup) / 9 g  · Choose: Sherbet (1 cup) / 2.5 g  · Choose: Nonfat frozen yogurt (1 cup) / 0.3 g  · Choose: Frozen fruit bar / trace  · Avoid: Whipped cream (1 tbs) / 3.5 g  · Choose: Nondairy whipped topping (1 tbs) / 1 g  Condiments / Saturated Fat (g)  · Avoid: Mayonnaise (1 tbs) / 2 g  · Choose: Low-fat mayonnaise (1 tbs) / 1 g  · Avoid: Butter (1 tbs) / 7 g  · Choose: Extra light margarine (1 tbs) / 1 g  · Avoid: Coconut oil (1 tbs) / 11.8 g  · Choose: Olive oil (1 tbs) / 1.8 g  · Choose: Corn oil (1 tbs) / 1.7 g  · Choose: Safflower oil (1 tbs) / 1.2 g  · Choose: Sunflower oil (1 tbs) / 1.4 g  · Choose: Soybean oil (1 tbs) / 2.4 g  · Choose: Canola oil (1 tbs) / 1 g  Document Released: 07/26/2005 Document Revised: 11/20/2012 Document Reviewed: 01/14/2011  [employer]® Patient Information ©2014 [employer].

## 2013-11-15 NOTE — Progress Notes (Signed)
MRN: [id number] Name: Kathy Schaefer  Sex: female Age: 53 y.o. DOB: 04/03/1961  Allergies: Cyclobenzaprine hcl; Guaifenesin; and Phenylpropanolamine hcl  Chief Complaint  Patient presents with  . Follow-up  . Medication Refill    HPI: Patient is 53 y.o. female who was history of chronic joint pain, patient has history of intervertebral disc bulging, patient is taking pain medications when necessary and is requesting refill on the medication, she had already been referred to neurosurgery but at this time she could not afford, she denies any worsening symptoms as per patient medications  Do help, patient also history of hypertriglyceridemia and has not been taking the medication.   Past Medical History  Diagnosis Date  . Head ache   . Degenerative disc disease   . GERD (gastroesophageal reflux disease)   . Depression     No past surgical history on file.    Medication List       This list is accurate as of: 11/15/13 12:30 PM.  Always use your most recent med list.               amitriptyline 25 MG tablet  Commonly known as:  ELAVIL  TAKE ONE TABLET BY MOUTH AT BEDTIME     diclofenac 75 MG EC tablet  Commonly known as:  VOLTAREN  Take 1 tablet (75 mg total) by mouth 2 (two) times daily.     fenofibrate 145 MG tablet  Commonly known as:  TRICOR  Take 1 tablet (145 mg total) by mouth daily.     fenofibrate 145 MG tablet  Commonly known as:  TRICOR  Take 1 tablet (145 mg total) by mouth daily.     traMADol 50 MG tablet  Commonly known as:  ULTRAM  Take 1 tablet (50 mg total) by mouth every 8 (eight) hours as needed.        Meds ordered this encounter  Medications  . amitriptyline (ELAVIL) 25 MG tablet    Sig: TAKE ONE TABLET BY MOUTH AT BEDTIME    Dispense:  30 tablet    Refill:  3  . diclofenac (VOLTAREN) 75 MG EC tablet    Sig: Take 1 tablet (75 mg total) by mouth 2 (two) times daily.    Dispense:  60 tablet    Refill:  3  . traMADol (ULTRAM) 50 MG  tablet    Sig: Take 1 tablet (50 mg total) by mouth every 8 (eight) hours as needed.    Dispense:  30 tablet    Refill:  0  . fenofibrate (TRICOR) 145 MG tablet    Sig: Take 1 tablet (145 mg total) by mouth daily.    Dispense:  30 tablet    Refill:  3     There is no immunization history on file for this patient.  No family history on file.  History  Substance Use Topics  . Smoking status: Never Smoker   . Smokeless tobacco: Not on file  . Alcohol Use: 0.5 oz/week    1 drink(s) per week    Review of Systems   As noted in HPI  Filed Vitals:   11/15/13 1204  BP: 136/84  Pulse: 84  Temp: 98.2 F (36.8 C)  Resp: 18    Physical Exam  Physical Exam  Constitutional: No distress.  Eyes: EOM are normal. Pupils are equal, round, and reactive to light.  Cardiovascular: Normal rate and regular rhythm.   Pulmonary/Chest: Breath sounds normal. No respiratory distress. She  has no wheezes. She has no rales.  Musculoskeletal:  Minimal upper back and lower lumbar paraspinal tenderness, equal strength all extremities.      CBC    Component Value Date/Time   WBC 5.4 03/27/2013 1623   RBC 4.44 03/27/2013 1623   HGB 13.4 03/27/2013 1623   HCT 39.1 03/27/2013 1623   PLT 250 03/27/2013 1623   MCV 88.1 03/27/2013 1623   LYMPHSABS 2.2 03/27/2013 1623   MONOABS 0.4 03/27/2013 1623   EOSABS 0.1 03/27/2013 1623   BASOSABS 0.0 03/27/2013 1623    CMP     Component Value Date/Time   NA 141 03/27/2013 1623   K 4.4 03/27/2013 1623   CL 101 03/27/2013 1623   CO2 33* 03/27/2013 1623   GLUCOSE 88 03/27/2013 1623   BUN 15 03/27/2013 1623   CREATININE 0.71 03/27/2013 1623   CREATININE 0.9 03/10/2010 0151   CALCIUM 9.8 03/27/2013 1623   PROT 6.9 03/27/2013 1623   ALBUMIN 4.3 03/27/2013 1623   AST 15 03/27/2013 1623   ALT 9 03/27/2013 1623   ALKPHOS 135* 03/27/2013 1623   BILITOT 0.3 03/27/2013 1623   GFRNONAA >89 03/27/2013 1623   GFRNONAA >60 03/10/2010 0142   GFRAA >89 03/27/2013 1623   GFRAA   Value: >60        The eGFR has been calculated using the MDRD equation. This calculation has not been validated in all clinical situations. eGFR's persistently <60 mL/min signify possible Chronic Kidney Disease. 03/10/2010 0142    Lab Results  Component Value Date/Time   CHOL 306* 03/27/2013  4:23 PM    No components found with this basename: hga1c    Lab Results  Component Value Date/Time   AST 15 03/27/2013  4:23 PM    Assessment and Plan  Osteoarthritis/Intervertebral disc protrusion  - Plan: diclofenac (VOLTAREN) 75 MG EC tablet, traMADol (ULTRAM) 50 MG tablet  High triglycerides - Plan: fenofibrate (TRICOR) 145 MG tablet   Screening - Plan: Vit D  25 hydroxy (rtn osteoporosis monitoring), TSH   Return in about 3 months (around 02/14/2014) for hypertriglycridemia, osteoarthritis.  Lorayne Marek, MD

## 2013-11-15 NOTE — Progress Notes (Signed)
Pt here for a follow up  Pt also needs meds refilled Pt also has questions about thyroid?

## 2013-11-16 DIAGNOSIS — IMO0002 Reserved for concepts with insufficient information to code with codable children: Secondary | ICD-10-CM | POA: Insufficient documentation

## 2013-11-16 DIAGNOSIS — E781 Pure hyperglyceridemia: Secondary | ICD-10-CM | POA: Insufficient documentation

## 2013-11-16 LAB — VITAMIN D 25 HYDROXY (VIT D DEFICIENCY, FRACTURES): Vit D, 25-Hydroxy: 33 ng/mL (ref 30–89)

## 2013-11-29 ENCOUNTER — Ambulatory Visit: Payer: Self-pay

## 2014-01-07 ENCOUNTER — Other Ambulatory Visit: Payer: Self-pay | Admitting: Internal Medicine

## 2014-01-07 DIAGNOSIS — M1990 Unspecified osteoarthritis, unspecified site: Secondary | ICD-10-CM

## 2014-01-10 ENCOUNTER — Other Ambulatory Visit: Payer: Self-pay | Admitting: Internal Medicine

## 2014-01-11 ENCOUNTER — Other Ambulatory Visit: Payer: Self-pay | Admitting: *Deleted

## 2014-01-11 MED ORDER — TRAMADOL HCL 50 MG PO TABS: ORAL_TABLET | ORAL | Status: DC

## 2014-01-24 ENCOUNTER — Other Ambulatory Visit: Payer: Self-pay | Admitting: Internal Medicine

## 2014-07-29 ENCOUNTER — Ambulatory Visit
Admission: RE | Admit: 2014-07-29 | Discharge: 2014-07-29 | Disposition: A | Discharge status: Home / Self Care | Payer: 59 | Source: Ambulatory Visit | Attending: Nurse Practitioner | Admitting: Nurse Practitioner

## 2014-07-29 ENCOUNTER — Other Ambulatory Visit: Payer: Self-pay | Admitting: Nurse Practitioner

## 2014-07-29 DIAGNOSIS — K5900 Constipation, unspecified: Secondary | ICD-10-CM

## 2014-10-13 ENCOUNTER — Emergency Department: Payer: Self-pay | Admitting: Student

## 2014-12-16 ENCOUNTER — Emergency Department
Admission: EM | Admit: 2014-12-16 | Discharge: 2014-12-16 | Disposition: A | Discharge status: Home / Self Care | Payer: Self-pay | Attending: Emergency Medicine | Admitting: Emergency Medicine

## 2014-12-16 ENCOUNTER — Emergency Department: Payer: No Typology Code available for payment source

## 2014-12-16 DIAGNOSIS — N12 Tubulo-interstitial nephritis, not specified as acute or chronic: Secondary | ICD-10-CM | POA: Insufficient documentation

## 2014-12-16 DIAGNOSIS — Z791 Long term (current) use of non-steroidal anti-inflammatories (NSAID): Secondary | ICD-10-CM | POA: Insufficient documentation

## 2014-12-16 DIAGNOSIS — M546 Pain in thoracic spine: Secondary | ICD-10-CM | POA: Insufficient documentation

## 2014-12-16 DIAGNOSIS — Z79899 Other long term (current) drug therapy: Secondary | ICD-10-CM | POA: Insufficient documentation

## 2014-12-16 DIAGNOSIS — R109 Unspecified abdominal pain: Secondary | ICD-10-CM

## 2014-12-16 LAB — URINALYSIS COMPLETE WITH MICROSCOPIC (ARMC ONLY)
Bilirubin Urine: NEGATIVE
Glucose, UA: NEGATIVE mg/dL
Hgb urine dipstick: NEGATIVE
Ketones, ur: NEGATIVE mg/dL
Nitrite: NEGATIVE
Protein, ur: NEGATIVE mg/dL
Specific Gravity, Urine: 1.009 (ref 1.005–1.030)
Trans Epithel, UA: <1
pH: 5 (ref 5.0–8.0)

## 2014-12-16 LAB — CBC WITH DIFFERENTIAL/PLATELET
Basophils Absolute: 0 10*3/uL (ref 0–0.1)
Basophils Relative: 1 %
Eosinophils Absolute: 0.1 10*3/uL (ref 0–0.7)
Eosinophils Relative: 3 %
HCT: 40.4 % (ref 35.0–47.0)
Hemoglobin: 13.5 g/dL (ref 12.0–16.0)
Lymphocytes Relative: 41 %
Lymphs Abs: 2.2 10*3/uL (ref 1.0–3.6)
MCH: 30.8 pg (ref 26.0–34.0)
MCHC: 33.4 g/dL (ref 32.0–36.0)
MCV: 92.2 fL (ref 80.0–100.0)
Monocytes Absolute: 0.4 10*3/uL (ref 0.2–0.9)
Monocytes Relative: 8 %
Neutro Abs: 2.6 10*3/uL (ref 1.4–6.5)
Neutrophils Relative %: 47 %
Platelets: 230 10*3/uL (ref 150–440)
RBC: 4.38 MIL/uL (ref 3.80–5.20)
RDW: 13.1 % (ref 11.5–14.5)
WBC: 5.5 10*3/uL (ref 3.6–11.0)

## 2014-12-16 LAB — BASIC METABOLIC PANEL
Anion gap: 10 (ref 5–15)
BUN: 15 mg/dL (ref 6–20)
CO2: 25 mmol/L (ref 22–32)
Calcium: 9.3 mg/dL (ref 8.9–10.3)
Chloride: 105 mmol/L (ref 101–111)
Creatinine, Ser: 0.78 mg/dL (ref 0.44–1.00)
GFR calc Af Amer: >60 mL/min (ref >60)
GFR calc non Af Amer: >60 mL/min (ref >60)
Glucose, Bld: 119 mg/dL — ABNORMAL HIGH (ref 65–99)
Potassium: 3.9 mmol/L (ref 3.5–5.1)
Sodium: 140 mmol/L (ref 135–145)

## 2014-12-16 LAB — TROPONIN I: Troponin I: <0.03 ng/mL (ref <0.031)

## 2014-12-16 MED ORDER — ONDANSETRON HCL 4 MG/2ML IJ SOLN
4.0000 mg | Freq: Once | INTRAMUSCULAR | Status: AC
  Administered 2014-12-16: 4 mg via INTRAVENOUS

## 2014-12-16 MED ORDER — ONDANSETRON HCL 4 MG/2ML IJ SOLN
INTRAMUSCULAR | Status: AC
  Filled 2014-12-16: qty 2

## 2014-12-16 MED ORDER — CIPROFLOXACIN HCL 500 MG PO TABS
500.0000 mg | ORAL_TABLET | Freq: Once | ORAL | Status: AC
  Administered 2014-12-16: 500 mg via ORAL

## 2014-12-16 MED ORDER — CIPROFLOXACIN HCL 500 MG PO TABS: 500.0000 mg | ORAL_TABLET | Freq: Two times a day (BID) | ORAL | Status: DC

## 2014-12-16 MED ORDER — MORPHINE SULFATE 4 MG/ML IJ SOLN
4.0000 mg | Freq: Once | INTRAMUSCULAR | Status: AC
  Administered 2014-12-16: 4 mg via INTRAVENOUS

## 2014-12-16 MED ORDER — SODIUM CHLORIDE 0.9 % IV BOLUS (SEPSIS)
1000.0000 mL | Freq: Once | INTRAVENOUS | Status: AC
  Administered 2014-12-16: 1000 mL via INTRAVENOUS

## 2014-12-16 MED ORDER — MORPHINE SULFATE 4 MG/ML IJ SOLN
INTRAMUSCULAR | Status: AC
  Filled 2014-12-16: qty 1

## 2014-12-16 MED ORDER — CIPROFLOXACIN HCL 500 MG PO TABS
ORAL_TABLET | ORAL | Status: AC
  Filled 2014-12-16: qty 1

## 2014-12-16 NOTE — ED Notes (Signed)
Pt c/o left flank pain with nausea since Thursday

## 2014-12-16 NOTE — ED Provider Notes (Signed)
[facility] Emergency Department Provider Note  ____________________________________________  Time seen: Approximately 9:29 AM  I have reviewed the triage vital signs and the nursing notes.   HISTORY  Chief Complaint Flank Pain    HPI Kathy Schaefer is a 54 y.o. female with a history of GERD who presents with 5 days of left flank pain. Says that it feels like a sharp and burning pain which is radiating around to her abdomen. Denies any dysuria. Denies any rash. Says that she has googled the symptoms and thinks she has a kidney stone or shingles. Does not have any history of kidney stones or family history of kidney stones. No nausea or vomiting. No chest pain or shortness of breath. No worsening factors.   Past Medical History  Diagnosis Date  . Head ache   . Degenerative disc disease   . GERD (gastroesophageal reflux disease)   . Depression     Patient Active Problem List   Diagnosis Date Noted  . High triglycerides 11/16/2013  . 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 Osteoarthritis 03/27/2013  . Depression (emotion) 03/27/2013  . GERD 06/13/2007  . 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 No pertinent past surgical history.  Current Outpatient Rx  Name  Route  Sig  Dispense  Refill  . amitriptyline (ELAVIL) 25 MG tablet      TAKE ONE TABLET BY MOUTH AT BEDTIME   30 tablet   3   . diclofenac (VOLTAREN) 75 MG EC tablet   Oral   Take 1 tablet (75 mg total) by mouth 2 (two) times daily. Patient taking differently: Take 75 mg by mouth 2 (two) times daily as needed for mild pain.    60 tablet   3   . naproxen sodium (ANAPROX) 220 MG tablet   Oral   Take 220 mg by mouth daily as needed (pain).         . ranitidine (ZANTAC) 75 MG tablet   Oral   Take 75 mg by mouth daily.         . traMADol (ULTRAM) 50 MG tablet      TAKE ONE TABLET BY MOUTH EVERY 8 HOURS AS NEEDED Patient taking differently: Take 50 mg by mouth at  bedtime.    30 tablet   0   . fenofibrate (TRICOR) 145 MG tablet   Oral   Take 1 tablet (145 mg total) by mouth daily. Patient not taking: Reported on 12/16/2014   30 tablet   3   . fenofibrate (TRICOR) 145 MG tablet   Oral   Take 1 tablet (145 mg total) by mouth daily. Patient not taking: Reported on 12/16/2014   30 tablet   3     Allergies Cyclobenzaprine hcl; Guaifenesin; and Phenylpropanolamine hcl  No family history on file.  Social History History  Substance Use Topics  . Smoking status: Never Smoker   . Smokeless tobacco: Never Used  . Alcohol Use: 0.5 oz/week    1 Standard drinks or equivalent per week    Review of Systems Constitutional: No fever/chills Eyes: No visual changes. ENT: No sore throat. Cardiovascular: Denies chest pain. Respiratory: Denies shortness of breath. Gastrointestinal: Left flank pain radiating around to the abdomen..  No nausea, no vomiting.  No diarrhea.  No constipation. Genitourinary: Negative for dysuria. Musculoskeletal: Left thoracic pain  Skin: Negative for rash. Neurological: Negative for headaches, focal weakness or numbness.  10-point ROS otherwise negative.  ____________________________________________   PHYSICAL EXAM:  VITAL SIGNS: ED Triage Vitals  Enc Vitals Group     BP 12/16/14 0846 128/66 mmHg     Pulse Rate 12/16/14 0846 91     Resp 12/16/14 0846 18     Temp 12/16/14 0846 98.2 F (36.8 C)     Temp Source 12/16/14 0846 Oral     SpO2 12/16/14 0846 100 %     Weight 12/16/14 0847 241 lb 4.8 oz (109.453 kg)     Height 12/16/14 0846 5\' 6"  (1.676 m)     Head Cir --      Peak Flow --      Pain Score 12/16/14 0848 10     Pain Loc --      Pain Edu? --      Excl. in GC? --     Constitutional: Alert and oriented. Well appearing and in no acute distress. Eyes: Conjunctivae are normal. PERRL. EOMI. Head: Atraumatic. Nose: No congestion/rhinnorhea. Mouth/Throat: Mucous membranes are moist.  Oropharynx  non-erythematous. Neck: No stridor.   Cardiovascular: Normal rate, regular rhythm. Grossly normal heart sounds.  Good peripheral circulation. Respiratory: Normal respiratory effort.  No retractions. Lungs CTAB. Gastrointestinal: Left upper quadrant with mild tenderness. Left CVA tenderness to palpation. Musculoskeletal: No lower extremity tenderness nor edema.  No joint effusions. Neurologic:  Normal speech and language. No gross focal neurologic deficits are appreciated. Speech is normal. No gait instability. Skin:  Skin is warm, dry and intact. No rash noted. Psychiatric: Mood and affect are normal. Speech and behavior are normal.  ____________________________________________   LABS (all labs ordered are listed, but only abnormal results are displayed)  Labs Reviewed  BASIC METABOLIC PANEL - Abnormal; Notable for the following:    Glucose, Bld 119 (*)    All other components within normal limits  URINALYSIS COMPLETEWITH MICROSCOPIC ([facility])  - Abnormal; Notable for the following:    Color, Urine YELLOW (*)    APPearance CLOUDY (*)    Leukocytes, UA 2+ (*)    Bacteria, UA RARE (*)    Squamous Epithelial / LPF 6-30 (*)    All other components within normal limits  CBC WITH DIFFERENTIAL/PLATELET  TROPONIN I   ____________________________________________  EKG   Date: 12/16/2014  Rate: 70  Rhythm: normal sinus rhythm  QRS Axis: normal  Intervals: normal  ST/T Wave abnormalities: normal  Conduction Disutrbances: none  Narrative Interpretation: unremarkable     ____________________________________________  RADIOLOGY  Cholelithiasis, ectatic abdominal aorta ____________________________________________    ____________________________________________   INITIAL IMPRESSION / ASSESSMENT AND PLAN / ED COURSE  Pertinent labs & imaging results that were available during my care of the patient were reviewed by me and considered in my medical decision making (see chart for  details).  ----------------------------------------- 12:29 PM on 12/16/2014 -----------------------------------------  Patient likely with pyelonephritis. Discussed results of CAT scan including the ectatic abdominal aorta. Patient given copy of the CT report. Will follow up with primary care doctor for further following of this abdominal finding. Has Tylenol at home for pain control. We'll give Cipro for pyelonephritis. ____________________________________________   FINAL CLINICAL IMPRESSION(S) / ED DIAGNOSES  Pyelonephritis. Flank pain. Acute, initial visit.    Arelia Longestavid M Shyan Scalisi, MD 12/16/14 [id number]

## 2014-12-17 ENCOUNTER — Emergency Department
Admission: EM | Admit: 2014-12-17 | Discharge: 2014-12-17 | Disposition: A | Discharge status: Home / Self Care | Payer: No Typology Code available for payment source | Attending: Emergency Medicine | Admitting: Emergency Medicine

## 2014-12-17 DIAGNOSIS — B029 Zoster without complications: Secondary | ICD-10-CM

## 2014-12-17 DIAGNOSIS — Z79899 Other long term (current) drug therapy: Secondary | ICD-10-CM | POA: Insufficient documentation

## 2014-12-17 DIAGNOSIS — Z791 Long term (current) use of non-steroidal anti-inflammatories (NSAID): Secondary | ICD-10-CM | POA: Insufficient documentation

## 2014-12-17 DIAGNOSIS — Z792 Long term (current) use of antibiotics: Secondary | ICD-10-CM | POA: Insufficient documentation

## 2014-12-17 MED ORDER — IBUPROFEN 800 MG PO TABS: 800.0000 mg | ORAL_TABLET | Freq: Three times a day (TID) | ORAL | Status: DC | PRN

## 2014-12-17 MED ORDER — GABAPENTIN 300 MG PO CAPS: 300.0000 mg | ORAL_CAPSULE | Freq: Three times a day (TID) | ORAL | Status: DC

## 2014-12-17 MED ORDER — KETOROLAC TROMETHAMINE 60 MG/2ML IM SOLN
60.0000 mg | Freq: Once | INTRAMUSCULAR | Status: AC
  Administered 2014-12-17: 60 mg via INTRAMUSCULAR

## 2014-12-17 MED ORDER — ACYCLOVIR 400 MG PO TABS: 400.0000 mg | ORAL_TABLET | Freq: Every day | ORAL | Status: AC

## 2014-12-17 MED ORDER — OXYCODONE-ACETAMINOPHEN 10-325 MG PO TABS: 1.0000 | ORAL_TABLET | Freq: Four times a day (QID) | ORAL | Status: DC | PRN

## 2014-12-17 MED ORDER — KETOROLAC TROMETHAMINE 60 MG/2ML IM SOLN
INTRAMUSCULAR | Status: AC
  Filled 2014-12-17: qty 2

## 2014-12-17 NOTE — ED Provider Notes (Signed)
[facility] Emergency Department Provider Note  ____________________________________________  Time seen: ----------------------------------------- 12:59 PM on 12/17/2014 -----------------------------------------    I have reviewed the triage vital signs and the nursing notes.   HISTORY  Chief Complaint Rash    HPI Kathy Schaefer is a 54 y.o. female patient reports that she was here yesterday and told the medical doctor that she had shingles even though she had no rash on her. She diagnosed with a kidney infection verified by [facility]. Today's states that she has a necrotic sign of shingles and that the rash has breaking out today that was not there yesterday.  Past Medical History  Diagnosis Date  . Head ache   . Degenerative disc disease   . GERD (gastroesophageal reflux disease)   . Depression     Patient Active Problem List   Diagnosis Date Noted  . High triglycerides 11/16/2013  . 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 Osteoarthritis 03/27/2013  . Depression (emotion) 03/27/2013  . GERD 06/13/2007  . PLANTAR FASCIITIS 06/13/2007    No past surgical history on file.  Current Outpatient Rx  Name  Route  Sig  Dispense  Refill  . acyclovir (ZOVIRAX) 400 MG tablet   Oral   Take 1 tablet (400 mg total) by mouth 5 (five) times daily.   60 tablet   0   . amitriptyline (ELAVIL) 25 MG tablet      TAKE ONE TABLET BY MOUTH AT BEDTIME   30 tablet   3   . ciprofloxacin (CIPRO) 500 MG tablet   Oral   Take 1 tablet (500 mg total) by mouth 2 (two) times daily.   24 tablet   0   . diclofenac (VOLTAREN) 75 MG EC tablet   Oral   Take 1 tablet (75 mg total) by mouth 2 (two) times daily. Patient taking differently: Take 75 mg by mouth 2 (two) times daily as needed for mild pain.    60 tablet   3   . fenofibrate (TRICOR) 145 MG tablet   Oral   Take 1 tablet (145 mg total) by mouth daily. Patient not taking: Reported on 12/16/2014   30  tablet   3   . fenofibrate (TRICOR) 145 MG tablet   Oral   Take 1 tablet (145 mg total) by mouth daily. Patient not taking: Reported on 12/16/2014   30 tablet   3   . gabapentin (NEURONTIN) 300 MG capsule   Oral   Take 1 capsule (300 mg total) by mouth 3 (three) times daily.   90 capsule   0   . ibuprofen (ADVIL,MOTRIN) 800 MG tablet   Oral   Take 1 tablet (800 mg total) by mouth every 8 (eight) hours as needed.   90 tablet   0   . naproxen sodium (ANAPROX) 220 MG tablet   Oral   Take 220 mg by mouth daily as needed (pain).         Marland Kitchen. oxyCODONE-acetaminophen (PERCOCET) 10-325 MG per tablet   Oral   Take 1 tablet by mouth every 6 (six) hours as needed for pain.   20 tablet   0   . ranitidine (ZANTAC) 75 MG tablet   Oral   Take 75 mg by mouth daily.         . traMADol (ULTRAM) 50 MG tablet      TAKE ONE TABLET BY MOUTH EVERY 8 HOURS AS NEEDED Patient taking differently: Take 50 mg by mouth at bedtime.  30 tablet   0     Allergies Cyclobenzaprine hcl; Guaifenesin; and Phenylpropanolamine hcl  No family history on file.  Social History History  Substance Use Topics  . Smoking status: Never Smoker   . Smokeless tobacco: Never Used  . Alcohol Use: 0.5 oz/week    1 Standard drinks or equivalent per week    Review of Systems Constitutional: No fever/chills Eyes: No visual changes. ENT: No sore throat. Cardiovascular: Denies chest pain. Respiratory: Denies shortness of breath. Gastrointestinal: No abdominal pain.  No nausea, no vomiting.  No diarrhea.  No constipation. Genitourinary: Negative for dysuria. Musculoskeletal: Negative for back pain. Skin: Positive for rash on the right side on both lumbar and abdomen along the same dermatome line Neurological: Negative for headaches, focal weakness or numbness.   10-point ROS otherwise negative.  ____________________________________________   PHYSICAL EXAM:  VITAL SIGNS: ED Triage Vitals  Enc  Vitals Group     BP 12/17/14 1154 110/70 mmHg     Pulse Rate 12/17/14 1154 89     Resp 12/17/14 1154 18     Temp 12/17/14 1154 97.9 F (36.6 C)     Temp Source 12/17/14 1154 Oral     SpO2 12/17/14 1154 100 %     Weight 12/17/14 1123 241 lb (109.317 kg)     Height 12/17/14 1123 5\' 6"  (1.676 m)     Head Cir --      Peak Flow --      Pain Score 12/17/14 1123 0     Pain Loc --      Pain Edu? --      Excl. in GC? --     Constitutional: Alert and oriented. Well appearing and in no acute distress. Cardiovascular: Normal rate, regular rhythm. Grossly normal heart sounds.  Good peripheral circulation. Respiratory: Normal respiratory effort.  No retractions. Lungs CTAB. Gastrointestinal: Soft and nontender. No distention. No abdominal bruits. No CVA tenderness. Musculoskeletal: No lower extremity tenderness nor edema.  No joint effusions. Neurologic:  Normal speech and language. No gross focal neurologic deficits are appreciated. Speech is normal. No gait instability. Skin:  Skin is warm, dry and intact. Rash noted along the right lumbar abdomen area along the same dermatomal line consistent with shingles. Vesicular lesions noted in various areas. Psychiatric: Mood and affect are normal. Speech and behavior are normal.  ____________________________________________   LABS (all labs ordered are listed, but only abnormal results are displayed)  Labs Reviewed - No data to display ____________________________________________  EKG  None ____________________________________________  RADIOLOGY  None ____________________________________________   PROCEDURES  Procedure(s) performed: None  Critical Care performed: No  ____________________________________________   INITIAL IMPRESSION / ASSESSMENT AND PLAN / ED COURSE  Pertinent labs & imaging results that were available during my care of the patient were reviewed by me and considered in my medical decision making (see chart for  details).  Discussed findings with patient and she agrees with the treatment for shingles. Will follow up with her PCP as needed. ____________________________________________   FINAL CLINICAL IMPRESSION(S) / ED DIAGNOSES  Final diagnoses:  Shingles      Evangeline DakinCharles M Kavi Almquist, PA-C 12/17/14 1314  Myrna Blazeravid Matthew Schaevitz, MD 12/17/14 [id number]

## 2014-12-17 NOTE — ED Notes (Signed)
Pt reports that she was here yesterday and told the MD that she had shingles even though she had no rash on her. She was diagnoised with a kidney infection and she believes that she doesn't have that . She states that she has every classic sign of shingles except the break out that she states that she developed today. She at first said they gave her a follow up and they told her to come back here. When I ask her why she did not have a follow up she reports that she" doesn't have the money to go there and she wants her medication for her shingles that she is diagnosing herself with"

## 2014-12-17 NOTE — Discharge Instructions (Signed)

## 2015-07-02 ENCOUNTER — Emergency Department (HOSPITAL_COMMUNITY)
Admission: EM | Admit: 2015-07-02 | Discharge: 2015-07-02 | Disposition: A | Discharge status: Home / Self Care | Payer: Self-pay | Attending: Emergency Medicine | Admitting: Emergency Medicine

## 2015-07-02 ENCOUNTER — Encounter (HOSPITAL_COMMUNITY): Payer: Self-pay | Admitting: Emergency Medicine

## 2015-07-02 ENCOUNTER — Emergency Department (HOSPITAL_COMMUNITY): Payer: No Typology Code available for payment source

## 2015-07-02 DIAGNOSIS — X58XXXA Exposure to other specified factors, initial encounter: Secondary | ICD-10-CM | POA: Insufficient documentation

## 2015-07-02 DIAGNOSIS — K219 Gastro-esophageal reflux disease without esophagitis: Secondary | ICD-10-CM | POA: Insufficient documentation

## 2015-07-02 DIAGNOSIS — Z79899 Other long term (current) drug therapy: Secondary | ICD-10-CM | POA: Insufficient documentation

## 2015-07-02 DIAGNOSIS — M545 Low back pain, unspecified: Secondary | ICD-10-CM

## 2015-07-02 DIAGNOSIS — Y9289 Other specified places as the place of occurrence of the external cause: Secondary | ICD-10-CM | POA: Insufficient documentation

## 2015-07-02 DIAGNOSIS — Y9389 Activity, other specified: Secondary | ICD-10-CM | POA: Insufficient documentation

## 2015-07-02 DIAGNOSIS — S3992XA Unspecified injury of lower back, initial encounter: Secondary | ICD-10-CM | POA: Insufficient documentation

## 2015-07-02 DIAGNOSIS — F329 Major depressive disorder, single episode, unspecified: Secondary | ICD-10-CM | POA: Insufficient documentation

## 2015-07-02 DIAGNOSIS — Y998 Other external cause status: Secondary | ICD-10-CM | POA: Insufficient documentation

## 2015-07-02 DIAGNOSIS — Z8739 Personal history of other diseases of the musculoskeletal system and connective tissue: Secondary | ICD-10-CM | POA: Insufficient documentation

## 2015-07-02 DIAGNOSIS — S79919A Unspecified injury of unspecified hip, initial encounter: Secondary | ICD-10-CM | POA: Insufficient documentation

## 2015-07-02 MED ORDER — PREDNISONE 10 MG PO TABS: 20.0000 mg | ORAL_TABLET | Freq: Every day | ORAL | Status: DC

## 2015-07-02 MED ORDER — METHYLPREDNISOLONE SODIUM SUCC 125 MG IJ SOLR
125.0000 mg | Freq: Once | INTRAMUSCULAR | Status: AC
  Administered 2015-07-02: 125 mg via INTRAVENOUS
  Filled 2015-07-02: qty 2

## 2015-07-02 MED ORDER — ONDANSETRON HCL 4 MG/2ML IJ SOLN
4.0000 mg | Freq: Once | INTRAMUSCULAR | Status: DC
  Filled 2015-07-02: qty 2

## 2015-07-02 MED ORDER — OXYCODONE-ACETAMINOPHEN 5-325 MG PO TABS: 1.0000 | ORAL_TABLET | Freq: Four times a day (QID) | ORAL | Status: DC | PRN

## 2015-07-02 MED ORDER — HYDROMORPHONE HCL 1 MG/ML IJ SOLN
1.0000 mg | Freq: Once | INTRAMUSCULAR | Status: AC
  Administered 2015-07-02: 1 mg via INTRAVENOUS
  Filled 2015-07-02: qty 1

## 2015-07-02 NOTE — ED Notes (Signed)
Patient transported to X-ray 

## 2015-07-02 NOTE — Discharge Instructions (Signed)
Follow up with your md as scheduled °

## 2015-07-02 NOTE — ED Notes (Signed)
MD at bedside. 

## 2015-07-02 NOTE — ED Provider Notes (Signed)
CSN: [id number]     Arrival date & time 07/02/15  1012 History   First MD Initiated Contact with Patient 07/02/15 1014     Chief Complaint  Patient presents with  . Back Pain  . Hip Pain     (Consider location/radiation/quality/duration/timing/severity/associated sxs/prior Treatment) Patient is a 54 y.o. female presenting with back pain and hip pain. The history is provided by the patient (The patient states that she injured her back yesterday when she was getting out of the car.).  Back Pain Location:  Lumbar spine Quality:  Aching Radiates to:  Does not radiate Pain severity:  Moderate Pain is:  Unable to specify Onset quality:  Sudden Associated symptoms: no abdominal pain, no chest pain and no headaches   Hip Pain Pertinent negatives include no chest pain, no abdominal pain and no headaches.    Past Medical History  Diagnosis Date  . Head ache   . Degenerative disc disease   . GERD (gastroesophageal reflux disease)   . Depression    History reviewed. No pertinent past surgical history. History reviewed. No pertinent family history. Social History  Substance Use Topics  . Smoking status: Never Smoker   . Smokeless tobacco: Never Used  . Alcohol Use: 0.5 oz/week    1 Standard drinks or equivalent per week   OB History    No data available     Review of Systems  Constitutional: Negative for appetite change and fatigue.  HENT: Negative for congestion, ear discharge and sinus pressure.   Eyes: Negative for discharge.  Respiratory: Negative for cough.   Cardiovascular: Negative for chest pain.  Gastrointestinal: Negative for abdominal pain and diarrhea.  Genitourinary: Negative for frequency and hematuria.  Musculoskeletal: Positive for back pain.  Skin: Negative for rash.  Neurological: Negative for seizures and headaches.  Psychiatric/Behavioral: Negative for hallucinations.      Allergies  Cyclobenzaprine hcl; Guaifenesin; and Phenylpropanolamine  hcl  Home Medications   Prior to Admission medications   Medication Sig Start Date End Date Taking? Authorizing Provider  amitriptyline (ELAVIL) 25 MG tablet TAKE ONE TABLET BY MOUTH AT BEDTIME 11/15/13  Yes Deepak Advani, MD  diclofenac (VOLTAREN) 75 MG EC tablet Take 1 tablet (75 mg total) by mouth 2 (two) times daily. 11/15/13  Yes Doris Cheadle, MD  gabapentin (NEURONTIN) 300 MG capsule Take 1 capsule (300 mg total) by mouth 3 (three) times daily. Patient taking differently: Take 300 mg by mouth 3 (three) times daily as needed (pain).  12/17/14 12/17/15 Yes Charles M Beers, PA-C  naproxen sodium (ANAPROX) 220 MG tablet Take 220 mg by mouth daily as needed (pain).   Yes Historical Provider, MD  ranitidine (ZANTAC) 75 MG tablet Take 75 mg by mouth daily.   Yes Historical Provider, MD  traMADol (ULTRAM) 50 MG tablet TAKE ONE TABLET BY MOUTH EVERY 8 HOURS AS NEEDED Patient taking differently: Take 50 mg by mouth at bedtime.  01/11/14  Yes Quentin Angst, MD  fenofibrate (TRICOR) 145 MG tablet Take 1 tablet (145 mg total) by mouth daily. Patient not taking: Reported on 12/16/2014 04/12/13   Nishant Dhungel, MD  oxyCODONE-acetaminophen (PERCOCET) 5-325 MG tablet Take 1 tablet by mouth every 6 (six) hours as needed. 07/02/15   Bethann Berkshire, MD  predniSONE (DELTASONE) 10 MG tablet Take 2 tablets (20 mg total) by mouth daily. 07/02/15   Bethann Berkshire, MD   BP 116/61 mmHg  Pulse 69  Temp(Src) 97.7 F (36.5 C) (Oral)  Resp 18  SpO2  97% Physical Exam  Constitutional: She is oriented to person, place, and time. She appears well-developed.  HENT:  Head: Normocephalic.  Eyes: Conjunctivae and EOM are normal. No scleral icterus.  Neck: Neck supple. No thyromegaly present.  Cardiovascular: Normal rate and regular rhythm.  Exam reveals no gallop and no friction rub.   No murmur heard. Pulmonary/Chest: No stridor. She has no wheezes. She has no rales. She exhibits no tenderness.  Abdominal: She exhibits  no distension. There is no tenderness. There is no rebound.  Musculoskeletal: Normal range of motion. She exhibits tenderness. She exhibits no edema.  Tender lumbar spine  Lymphadenopathy:    She has no cervical adenopathy.  Neurological: She is oriented to person, place, and time. She exhibits normal muscle tone. Coordination normal.  Negative straight leg raise  Skin: No rash noted. No erythema.  Psychiatric: She has a normal mood and affect. Her behavior is normal.    ED Course  Procedures (including critical care time) Labs Review Labs Reviewed - No data to display  Imaging Review Dg Lumbar Spine Complete  07/02/2015  CLINICAL DATA:  Low back injury and pain while getting out of car yesterday. Initial encounter. EXAM: LUMBAR SPINE - COMPLETE 4+ VIEW COMPARISON:  None. FINDINGS: No evidence acute lumbar spine fracture. Intervertebral disc spaces are maintained. 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 Grade 1 anterolisthesis noted at L4-5 measuring approximately 5 mm, likely degenerative in etiology given facet DJD at this level. IMPRESSION: No acute findings. Lower lumbar spine facet DJD and grade 1 anterolisthesis at L4-5. Electronically Signed   By: Myles RosenthalJohn  Stahl M.D.   On: 07/02/2015 12:09   I have personally reviewed and evaluated these images and lab results as part of my medical decision-making.   EKG Interpretation None      MDM   Final diagnoses:  Back pain at L4-L5 level    Patient with low back pain and sciatica down her right leg. X-rays show some arthritic changes. Patient improved with  steroids and pain medicine.  Doubt ruptured disc. Patient will be sent home with Percocet and prednisone and will follow up with her PCP    Bethann BerkshireJoseph Tayra Dawe, MD 07/02/15 1546

## 2015-07-02 NOTE — ED Notes (Signed)
Per pt, states "something happened" to her lower back yesterday as she was getting in/out of car-states history of DJD-states she feels dizzy, possibly due to her Neurontin she took earlier this am

## 2015-07-02 NOTE — ED Notes (Signed)
Discharge instructions and rx x2 reviewed with patient. Patient verbalized understanding. 

## 2016-01-27 ENCOUNTER — Emergency Department: Payer: Self-pay

## 2016-01-27 ENCOUNTER — Emergency Department
Admission: EM | Admit: 2016-01-27 | Discharge: 2016-01-27 | Disposition: A | Discharge status: Home / Self Care | Payer: Self-pay | Attending: Emergency Medicine | Admitting: Emergency Medicine

## 2016-01-27 DIAGNOSIS — W19XXXA Unspecified fall, initial encounter: Secondary | ICD-10-CM

## 2016-01-27 DIAGNOSIS — Y999 Unspecified external cause status: Secondary | ICD-10-CM | POA: Insufficient documentation

## 2016-01-27 DIAGNOSIS — W010XXA Fall on same level from slipping, tripping and stumbling without subsequent striking against object, initial encounter: Secondary | ICD-10-CM | POA: Insufficient documentation

## 2016-01-27 DIAGNOSIS — Y929 Unspecified place or not applicable: Secondary | ICD-10-CM | POA: Insufficient documentation

## 2016-01-27 DIAGNOSIS — Z791 Long term (current) use of non-steroidal anti-inflammatories (NSAID): Secondary | ICD-10-CM | POA: Insufficient documentation

## 2016-01-27 DIAGNOSIS — F329 Major depressive disorder, single episode, unspecified: Secondary | ICD-10-CM | POA: Insufficient documentation

## 2016-01-27 DIAGNOSIS — S161XXA Strain of muscle, fascia and tendon at neck level, initial encounter: Secondary | ICD-10-CM | POA: Insufficient documentation

## 2016-01-27 DIAGNOSIS — Y9301 Activity, walking, marching and hiking: Secondary | ICD-10-CM | POA: Insufficient documentation

## 2016-01-27 DIAGNOSIS — S40012A Contusion of left shoulder, initial encounter: Secondary | ICD-10-CM | POA: Insufficient documentation

## 2016-01-27 MED ORDER — BACLOFEN 10 MG PO TABS: 10.0000 mg | ORAL_TABLET | Freq: Three times a day (TID) | ORAL | Status: DC

## 2016-01-27 MED ORDER — HYDROCODONE-ACETAMINOPHEN 5-325 MG PO TABS: 1.0000 | ORAL_TABLET | ORAL | Status: DC | PRN

## 2016-01-27 NOTE — ED Notes (Signed)
See triage note...the patient denies LOC, dizziness or chgs in vision.  Sts she has DDD and arthritis and fall has made pain worse.  C/O h/a and neck pain.  Pt sts she has had dry mouth since yesterday.  PERRLA

## 2016-01-27 NOTE — Discharge Instructions (Signed)
Cervical Sprain °A cervical sprain is an injury in the neck in which the strong, fibrous tissues (ligaments) that connect your neck bones stretch or tear. Cervical sprains can range from mild to severe. Severe cervical sprains can cause the neck vertebrae to be unstable. This can lead to damage of the spinal cord and can result in serious nervous system problems. The amount of time it takes for a cervical sprain to get better depends on the cause and extent of the injury. Most cervical sprains heal in 1 to 3 weeks. °CAUSES  °Severe cervical sprains may be caused by:  °· Contact sport injuries (such as from football, rugby, wrestling, hockey, auto racing, gymnastics, diving, martial arts, or boxing).   °· Motor vehicle collisions.   °· Whiplash injuries. This is an injury from a sudden forward and backward whipping movement of the head and neck.  °· Falls.   °Mild cervical sprains may be caused by:  °· Being in an awkward position, such as while cradling a telephone between your ear and shoulder.   °· Sitting in a chair that does not offer proper support.   °· Working at a poorly designed computer station.   °· Looking up or down for long periods of time.   °SYMPTOMS  °· Pain, soreness, stiffness, or a burning sensation in the front, back, or sides of the neck. This discomfort may develop immediately after the injury or slowly, 24 hours or more after the injury.   °· Pain or tenderness directly in the middle of the back of the neck.   °· Shoulder or upper back pain.   °· Limited ability to move the neck.   °· Headache.   °· Dizziness.   °· Weakness, numbness, or tingling in the hands or arms.   °· Muscle spasms.   °· Difficulty swallowing or chewing.   °· Tenderness and swelling of the neck.   °DIAGNOSIS  °Most of the time your health care provider can diagnose a cervical sprain by taking your history and doing a physical exam. Your health care provider will ask about previous neck injuries and any known neck  problems, such as arthritis in the neck. X-rays may be taken to find out if there are any other problems, such as with the bones of the neck. Other tests, such as a CT scan or MRI, may also be needed.  °TREATMENT  °Treatment depends on the severity of the cervical sprain. Mild sprains can be treated with rest, keeping the neck in place (immobilization), and pain medicines. Severe cervical sprains are immediately immobilized. Further treatment is done to help with pain, muscle spasms, and other symptoms and may include: °· Medicines, such as pain relievers, numbing medicines, or muscle relaxants.   °· Physical therapy. This may involve stretching exercises, strengthening exercises, and posture training. Exercises and improved posture can help stabilize the neck, strengthen muscles, and help stop symptoms from returning.   °HOME CARE INSTRUCTIONS  °· Put ice on the injured area.   °¨ Put ice in a plastic bag.   °¨ Place a towel between your skin and the bag.   °¨ Leave the ice on for 15-20 minutes, 3-4 times a day.   °· If your injury was severe, you may have been given a cervical collar to wear. A cervical collar is a two-piece collar designed to keep your neck from moving while it heals. °¨ Do not remove the collar unless instructed by your health care provider. °¨ If you have long hair, keep it outside of the collar. °¨ Ask your health care provider before making any adjustments to your collar. Minor   adjustments may be required over time to improve comfort and reduce pressure on your chin or on the back of your head. °¨ If you are allowed to remove the collar for cleaning or bathing, follow your health care provider's instructions on how to do so safely. °¨ Keep your collar clean by wiping it with mild soap and water and drying it completely. If the collar you have been given includes removable pads, remove them every 1-2 days and hand wash them with soap and water. Allow them to air dry. They should be completely  dry before you wear them in the collar. °¨ If you are allowed to remove the collar for cleaning and bathing, wash and dry the skin of your neck. Check your skin for irritation or sores. If you see any, tell your health care provider. °¨ Do not drive while wearing the collar.   °· Only take over-the-counter or prescription medicines for pain, discomfort, or fever as directed by your health care provider.   °· Keep all follow-up appointments as directed by your health care provider.   °· Keep all physical therapy appointments as directed by your health care provider.   °· Make any needed adjustments to your workstation to promote good posture.   °· Avoid positions and activities that make your symptoms worse.   °· Warm up and stretch before being active to help prevent problems.   °SEEK MEDICAL CARE IF:  °· Your pain is not controlled with medicine.   °· You are unable to decrease your pain medicine over time as planned.   °· Your activity level is not improving as expected.   °SEEK IMMEDIATE MEDICAL CARE IF:  °· You develop any bleeding. °· You develop stomach upset. °· You have signs of an allergic reaction to your medicine.   °· Your symptoms get worse.   °· You develop new, unexplained symptoms.   °· You have numbness, tingling, weakness, or paralysis in any part of your body.   °MAKE SURE YOU:  °· Understand these instructions. °· Will watch your condition. °· Will get help right away if you are not doing well or get worse. °  °This information is not intended to replace advice given to you by your health care provider. Make sure you discuss any questions you have with your health care provider. °  °Document Released: 05/23/2007 Document Revised: 07/31/2013 Document Reviewed: 01/31/2013 °Elsevier Interactive Patient Education ©2016 [employer] ° °Muscle Strain °A muscle strain is an injury that occurs when a muscle is stretched beyond its normal length. Usually a small number of muscle fibers are torn when this  happens. Muscle strain is rated in degrees. First-degree strains have the least amount of muscle fiber tearing and pain. Second-degree and third-degree strains have increasingly more tearing and pain.  °Usually, recovery from muscle strain takes 1-2 weeks. Complete healing takes 5-6 weeks.  °CAUSES  °Muscle strain happens when a sudden, violent force placed on a muscle stretches it too far. This may occur with lifting, sports, or a fall.  °RISK FACTORS °Muscle strain is especially common in athletes.  °SIGNS AND SYMPTOMS °At the site of the muscle strain, there may be: °· Pain. °· Bruising. °· Swelling. °· Difficulty using the muscle due to pain or lack of normal function. °DIAGNOSIS  °Your health care provider will perform a physical exam and ask about your medical history. °TREATMENT  °Often, the Daisie Haft treatment for a muscle strain is resting, icing, and applying cold compresses to the injured area.   °HOME CARE INSTRUCTIONS  °· Use the PRICE method of   treatment to promote muscle healing during the first 2-3 days after your injury. The PRICE method involves:  Protecting the muscle from being injured again.  Restricting your activity and resting the injured body part.  Icing your injury. To do this, put ice in a plastic bag. Place a towel between your skin and the bag. Then, apply the ice and leave it on from 15-20 minutes each hour. After the third day, switch to moist heat packs.  Apply compression to the injured area with a splint or elastic bandage. Be careful not to wrap it too tightly. This may interfere with blood circulation or increase swelling.  Elevate the injured body part above the level of your heart as often as you can.  Only take over-the-counter or prescription medicines for pain, discomfort, or fever as directed by your health care provider.  Warming up prior to exercise helps to prevent future muscle strains. SEEK MEDICAL CARE IF:   You have increasing pain or swelling in the  injured area.  You have numbness, tingling, or a significant loss of strength in the injured area. MAKE SURE YOU:   Understand these instructions.  Will watch your condition.  Will get help right away if you are not doing well or get worse.   This information is not intended to replace advice given to you by your health care provider. Make sure you discuss any questions you have with your health care provider.   Document Released: 07/26/2005 Document Revised: 05/16/2013 Document Reviewed: 02/22/2013 Elsevier Interactive Patient Education 2016 [employer]  Contusion A contusion is a deep bruise. Contusions are the result of a blunt injury to tissues and muscle fibers under the skin. The injury causes bleeding under the skin. The skin overlying the contusion may turn blue, purple, or yellow. Minor injuries will give you a painless contusion, but more severe contusions may stay painful and swollen for a few weeks.  CAUSES  This condition is usually caused by a blow, trauma, or direct force to an area of the body. SYMPTOMS  Symptoms of this condition include:  Swelling of the injured area.  Pain and tenderness in the injured area.  Discoloration. The area may have redness and then turn blue, purple, or yellow. DIAGNOSIS  This condition is diagnosed based on a physical exam and medical history. An X-ray, CT scan, or MRI may be needed to determine if there are any associated injuries, such as broken bones (fractures). TREATMENT  Specific treatment for this condition depends on what area of the body was injured. In general, the best treatment for a contusion is resting, icing, applying pressure to (compression), and elevating the injured area. This is often called the RICE strategy. Over-the-counter anti-inflammatory medicines may also be recommended for pain control.  HOME CARE INSTRUCTIONS   Rest the injured area.  If directed, apply ice to the injured area:  Put ice in a plastic  bag.  Place a towel between your skin and the bag.  Leave the ice on for 20 minutes, 2-3 times per day.  If directed, apply light compression to the injured area using an elastic bandage. Make sure the bandage is not wrapped too tightly. Remove and reapply the bandage as directed by your health care provider.  If possible, raise (elevate) the injured area above the level of your heart while you are sitting or lying down.  Take over-the-counter and prescription medicines only as told by your health care provider. SEEK MEDICAL CARE IF:  Your symptoms  do not improve after several days of treatment.  Your symptoms get worse.  You have difficulty moving the injured area. SEEK IMMEDIATE MEDICAL CARE IF:   You have severe pain.  You have numbness in a hand or foot.  Your hand or foot turns pale or cold.   This information is not intended to replace advice given to you by your health care provider. Make sure you discuss any questions you have with your health care provider.   Document Released: 05/05/2005 Document Revised: 04/16/2015 Document Reviewed: 12/11/2014 Elsevier Interactive Patient Education [employer]

## 2016-01-27 NOTE — ED Provider Notes (Signed)
[facility] Emergency Department Provider Note  ____________________________________________  Time seen: Approximately 3:54 PM  I have reviewed the triage vital signs and the nursing notes.   HISTORY  Chief Complaint Fall and Knee Pain    HPI Kathy Schaefer is a 55 y.o. female [facility] states that she slipped and fell with water on the floor. States that she landed injuring her left knee, her left shoulder, in her neck. In addition patient states that her mouth has been incredibly dry getting worse. She reports her knee is worse when she changes positions such as getting up and down. Currently describes her pain as 6/10 nonradiating. She reports taking her diclofenac this morning for pain.   Past Medical History  Diagnosis Date  . Head ache   . Degenerative disc disease   . GERD (gastroesophageal reflux disease)   . Depression     Patient Active Problem List   Diagnosis Date Noted  . High triglycerides 11/16/2013  . 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 Osteoarthritis 03/27/2013  . Depression (emotion) 03/27/2013  . GERD 06/13/2007  . 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 No pertinent past surgical history.  Current Outpatient Rx  Name  Route  Sig  Dispense  Refill  . amitriptyline (ELAVIL) 25 MG tablet      TAKE ONE TABLET BY MOUTH AT BEDTIME   30 tablet   3   . diclofenac (VOLTAREN) 75 MG EC tablet   Oral   Take 1 tablet (75 mg total) by mouth 2 (two) times daily.   60 tablet   3   . fenofibrate (TRICOR) 145 MG tablet   Oral   Take 1 tablet (145 mg total) by mouth daily. Patient not taking: Reported on 12/16/2014   30 tablet   3   . EXPIRED: gabapentin (NEURONTIN) 300 MG capsule   Oral   Take 1 capsule (300 mg total) by mouth 3 (three) times daily. Patient taking differently: Take 300 mg by mouth 3 (three) times daily as needed (pain).    90 capsule   0   . naproxen  sodium (ANAPROX) 220 MG tablet   Oral   Take 220 mg by mouth daily as needed (pain).         Marland Kitchen. oxyCODONE-acetaminophen (PERCOCET) 5-325 MG tablet   Oral   Take 1 tablet by mouth every 6 (six) hours as needed.   30 tablet   0   . predniSONE (DELTASONE) 10 MG tablet   Oral   Take 2 tablets (20 mg total) by mouth daily.   14 tablet   0   . ranitidine (ZANTAC) 75 MG tablet   Oral   Take 75 mg by mouth daily.         . traMADol (ULTRAM) 50 MG tablet      TAKE ONE TABLET BY MOUTH EVERY 8 HOURS AS NEEDED Patient taking differently: Take 50 mg by mouth at bedtime.    30 tablet   0     Allergies Cyclobenzaprine hcl; Guaifenesin; and Phenylpropanolamine hcl  No family history on file.  Social History Social History  Substance Use Topics  . Smoking status: Never Smoker   . Smokeless tobacco: Never Used  . Alcohol Use: 0.5 oz/week    1 Standard drinks or equivalent per week    Review of Systems Constitutional: No fever/chills Eyes: No visual changes. ENT: No sore throat. Positive for dry mouth. Cardiovascular: Denies  chest pain. Respiratory: Denies shortness of breath. Gastrointestinal: No abdominal pain.  No nausea, no vomiting.  No diarrhea.  No constipation. Genitourinary: Negative for dysuria. Musculoskeletal: Positive for neck pain, left shoulder pain, left knee pain. Skin: Negative for rash. Neurological: Negative for headaches, focal weakness or numbness. Describes a sensation in her head as a 10-point ROS otherwise negative.  ____________________________________________   PHYSICAL EXAM:  VITAL SIGNS: ED Triage Vitals  Enc Vitals Group     BP 01/27/16 1512 124/72 mmHg     Pulse Rate 01/27/16 1512 89     Resp 01/27/16 1512 16     Temp 01/27/16 1512 98.2 F (36.8 C)     Temp Source 01/27/16 1512 Oral     SpO2 01/27/16 1512 100 %     Weight 01/27/16 1512 230 lb 12.8 oz (104.69 kg)     Height 01/27/16 1512  (1.676 m)     Head Cir --      Peak  Flow --      Pain Score 01/27/16 1514 6     Pain Loc --      Pain Edu? --      Excl. in GC? --     Constitutional: Alert and oriented. Well appearing and in no acute distress. Eyes: Conjunctivae are normal. PERRL. EOMI. Head: Atraumatic. Nose: No congestion/rhinnorhea. Mouth/Throat: Mucous membranes are moist.  Oropharynx non-erythematous. Neck: No stridor.  Full range of motion with some left paraspinal tenderness. Cardiovascular: Normal rate, regular rhythm. Grossly normal heart sounds.  Good peripheral circulation. Respiratory: Normal respiratory effort.  No retractions. Lungs CTAB. Musculoskeletal: Point tenderness noted to the left scapular area as well as the left lateral patellar area. Neurologic:  Normal speech and language. No gross focal neurologic deficits are appreciated. No gait instability. Skin: She has a 1 cm nummular area of skin abrasion just distal to the left knee. Psychiatric: Mood and affect are normal. Speech and behavior are normal.  ____________________________________________   LABS (all labs ordered are listed, but only abnormal results are displayed)  Labs Reviewed - No data to display ____________________________________________  EKG   ____________________________________________  RADIOLOGY  Cervical muscle strains noted. No acute osseous findings noted anywhere else. ____________________________________________   PROCEDURES  Procedure(s) performed: None  Critical Care performed: No  ____________________________________________   INITIAL IMPRESSION / ASSESSMENT AND PLAN / ED COURSE  Pertinent labs & imaging results that were available during my care of the patient were reviewed by me and considered in my medical decision making (see chart for details).  Status post fall with acute cervical muscle strain. Rx given for baclofen 10 mg 3 times a day and Vicodin 5/325 as needed for severe pain at nighttime. Patient follow-up with PCP or  return to the ER with any worsening symptomology. ____________________________________________   FINAL CLINICAL IMPRESSION(S) / ED DIAGNOSES  Final diagnoses:  None     This chart was dictated using voice recognition software/Dragon. Despite best efforts to proofread, errors can occur which can change the meaning. Any change was purely unintentional.   Evangeline Dakin, PA-C 01/27/16 1705  Rockne Menghini, MD 01/27/16 2149

## 2016-01-27 NOTE — ED Notes (Signed)
Pt states she slipped and fell yesterday on the wet floor at [employer] and is having left knee, shoulder and neck pain/stiffness.the patient ambulatory to triage without limp or difficulty..Marland Kitchen

## 2016-04-14 ENCOUNTER — Encounter: Payer: Self-pay | Admitting: Emergency Medicine

## 2016-04-14 ENCOUNTER — Emergency Department
Admission: EM | Admit: 2016-04-14 | Discharge: 2016-04-14 | Disposition: A | Discharge status: Home / Self Care | Payer: No Typology Code available for payment source | Attending: Emergency Medicine | Admitting: Emergency Medicine

## 2016-04-14 DIAGNOSIS — R42 Dizziness and giddiness: Secondary | ICD-10-CM | POA: Insufficient documentation

## 2016-04-14 DIAGNOSIS — R109 Unspecified abdominal pain: Secondary | ICD-10-CM | POA: Insufficient documentation

## 2016-04-14 DIAGNOSIS — Z5321 Procedure and treatment not carried out due to patient leaving prior to being seen by health care provider: Secondary | ICD-10-CM | POA: Insufficient documentation

## 2016-04-14 HISTORY — DX: Abdominal aortic aneurysm, without rupture: I71.4

## 2016-04-14 HISTORY — DX: Abdominal aortic aneurysm, without rupture, unspecified: I71.40

## 2016-04-14 LAB — CBC
HCT: 39.8 % (ref 35.0–47.0)
Hemoglobin: 14.2 g/dL (ref 12.0–16.0)
MCH: 32.1 pg (ref 26.0–34.0)
MCHC: 35.7 g/dL (ref 32.0–36.0)
MCV: 89.9 fL (ref 80.0–100.0)
Platelets: 228 10*3/uL (ref 150–440)
RBC: 4.43 MIL/uL (ref 3.80–5.20)
RDW: 12.6 % (ref 11.5–14.5)
WBC: 5.4 10*3/uL (ref 3.6–11.0)

## 2016-04-14 LAB — BASIC METABOLIC PANEL
Anion gap: 6 (ref 5–15)
BUN: 14 mg/dL (ref 6–20)
CO2: 27 mmol/L (ref 22–32)
Calcium: 9.1 mg/dL (ref 8.9–10.3)
Chloride: 103 mmol/L (ref 101–111)
Creatinine, Ser: 0.74 mg/dL (ref 0.44–1.00)
GFR calc Af Amer: >60 mL/min (ref >60)
GFR calc non Af Amer: >60 mL/min (ref >60)
Glucose, Bld: 107 mg/dL — ABNORMAL HIGH (ref 65–99)
Potassium: 4 mmol/L (ref 3.5–5.1)
Sodium: 136 mmol/L (ref 135–145)

## 2016-04-14 LAB — URINALYSIS COMPLETE WITH MICROSCOPIC (ARMC ONLY)
Bacteria, UA: NONE SEEN
Bilirubin Urine: NEGATIVE
Glucose, UA: NEGATIVE mg/dL
Hgb urine dipstick: NEGATIVE
Ketones, ur: NEGATIVE mg/dL
Nitrite: NEGATIVE
Protein, ur: NEGATIVE mg/dL
RBC / HPF: NONE SEEN RBC/hpf (ref 0–5)
Specific Gravity, Urine: 1.006 (ref 1.005–1.030)
pH: 8 (ref 5.0–8.0)

## 2016-04-14 NOTE — ED Notes (Signed)
Pt. To nurses station and restroom at approx. 1512 and did not return to tx. Room. This RN checked on pt. After no return in ten minutes and pt. Not in restroom, nor found throughout remaining ED pods. This RN checked the lobby, subwait, and asked other staff remembers if they witnessed the patient depart without success in locating the patient. At this time, Patient has still not been located or returned to tx. Room and is being d/c from the system per elopement.

## 2016-04-14 NOTE — ED Provider Notes (Signed)
Patient was brought back to the ED treatment room but subsequently left the room prior to evaluation. She is currently nowhere to be found. I will evaluate if she returns.   Sharman CheekPhillip Raeley Gilmore, MD 04/14/16 [phone]

## 2016-05-29 ENCOUNTER — Other Ambulatory Visit: Payer: Self-pay | Admitting: Obstetrics and Gynecology

## 2016-05-29 DIAGNOSIS — Z1231 Encounter for screening mammogram for malignant neoplasm of breast: Secondary | ICD-10-CM

## 2016-06-10 ENCOUNTER — Ambulatory Visit (HOSPITAL_COMMUNITY): Payer: Self-pay

## 2016-08-09 DIAGNOSIS — I639 Cerebral infarction, unspecified: Secondary | ICD-10-CM

## 2016-08-09 HISTORY — DX: Cerebral infarction, unspecified: I63.9

## 2017-06-06 ENCOUNTER — Emergency Department: Payer: No Typology Code available for payment source

## 2017-06-06 DIAGNOSIS — Z5321 Procedure and treatment not carried out due to patient leaving prior to being seen by health care provider: Secondary | ICD-10-CM | POA: Insufficient documentation

## 2017-06-06 DIAGNOSIS — R079 Chest pain, unspecified: Secondary | ICD-10-CM | POA: Insufficient documentation

## 2017-06-06 LAB — BASIC METABOLIC PANEL
Anion gap: 10 (ref 5–15)
BUN: 15 mg/dL (ref 6–20)
CO2: 28 mmol/L (ref 22–32)
Calcium: 9.9 mg/dL (ref 8.9–10.3)
Chloride: 101 mmol/L (ref 101–111)
Creatinine, Ser: 0.88 mg/dL (ref 0.44–1.00)
GFR calc Af Amer: >60 mL/min (ref >60)
GFR calc non Af Amer: >60 mL/min (ref >60)
Glucose, Bld: 90 mg/dL (ref 65–99)
Potassium: 4.4 mmol/L (ref 3.5–5.1)
Sodium: 139 mmol/L (ref 135–145)

## 2017-06-06 LAB — CBC
HCT: 44.8 % (ref 35.0–47.0)
Hemoglobin: 15.4 g/dL (ref 12.0–16.0)
MCH: 31.2 pg (ref 26.0–34.0)
MCHC: 34.3 g/dL (ref 32.0–36.0)
MCV: 91.1 fL (ref 80.0–100.0)
Platelets: 277 10*3/uL (ref 150–440)
RBC: 4.92 MIL/uL (ref 3.80–5.20)
RDW: 13.2 % (ref 11.5–14.5)
WBC: 8 10*3/uL (ref 3.6–11.0)

## 2017-06-06 LAB — TROPONIN I: Troponin I: <0.03 ng/mL (ref <0.03)

## 2017-06-06 NOTE — ED Triage Notes (Signed)
Reports not feeling well all day.  Reports started with pain feeling in groin area.  Tonight when she laid down had an icy/hot feeling across her chest, continued pain in left groin and pain that radiated down her left leg.  Patient reports slight shortness of breath.

## 2017-06-07 ENCOUNTER — Telehealth: Payer: Self-pay | Admitting: Emergency Medicine

## 2017-06-07 ENCOUNTER — Emergency Department
Admission: EM | Admit: 2017-06-07 | Discharge: 2017-06-07 | Disposition: A | Discharge status: Home / Self Care | Payer: No Typology Code available for payment source | Attending: Emergency Medicine | Admitting: Emergency Medicine

## 2017-06-07 NOTE — ED Notes (Signed)
No answer when called several times from lobby 

## 2017-06-07 NOTE — Telephone Encounter (Signed)
Called patient due to lwot to inquire about condition and follow up plans. 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

## 2017-06-10 ENCOUNTER — Emergency Department (HOSPITAL_COMMUNITY): Payer: Self-pay

## 2017-06-10 ENCOUNTER — Encounter (HOSPITAL_COMMUNITY): Payer: Self-pay

## 2017-06-10 ENCOUNTER — Other Ambulatory Visit (HOSPITAL_COMMUNITY): Payer: Self-pay | Admitting: General Practice

## 2017-06-10 ENCOUNTER — Other Ambulatory Visit: Payer: Self-pay

## 2017-06-10 ENCOUNTER — Emergency Department (HOSPITAL_COMMUNITY)
Admission: EM | Admit: 2017-06-10 | Discharge: 2017-06-10 | Disposition: A | Discharge status: Home / Self Care | Payer: Self-pay | Attending: Emergency Medicine | Admitting: Emergency Medicine

## 2017-06-10 DIAGNOSIS — Z8639 Personal history of other endocrine, nutritional and metabolic disease: Secondary | ICD-10-CM | POA: Insufficient documentation

## 2017-06-10 DIAGNOSIS — R479 Unspecified speech disturbances: Secondary | ICD-10-CM

## 2017-06-10 DIAGNOSIS — Z79899 Other long term (current) drug therapy: Secondary | ICD-10-CM | POA: Insufficient documentation

## 2017-06-10 DIAGNOSIS — R410 Disorientation, unspecified: Secondary | ICD-10-CM

## 2017-06-10 DIAGNOSIS — F329 Major depressive disorder, single episode, unspecified: Secondary | ICD-10-CM | POA: Insufficient documentation

## 2017-06-10 DIAGNOSIS — R4789 Other speech disturbances: Secondary | ICD-10-CM | POA: Insufficient documentation

## 2017-06-10 LAB — URINALYSIS, ROUTINE W REFLEX MICROSCOPIC
Bilirubin Urine: NEGATIVE
Glucose, UA: NEGATIVE mg/dL
Hgb urine dipstick: NEGATIVE
Ketones, ur: NEGATIVE mg/dL
Leukocytes, UA: NEGATIVE
Nitrite: NEGATIVE
Protein, ur: NEGATIVE mg/dL
Specific Gravity, Urine: 1.002 — ABNORMAL LOW (ref 1.005–1.030)
pH: 8 (ref 5.0–8.0)

## 2017-06-10 LAB — COMPREHENSIVE METABOLIC PANEL
ALT: 19 U/L (ref 14–54)
AST: 28 U/L (ref 15–41)
Albumin: 4.1 g/dL (ref 3.5–5.0)
Alkaline Phosphatase: 119 U/L (ref 38–126)
Anion gap: 7 (ref 5–15)
BUN: 11 mg/dL (ref 6–20)
CO2: 25 mmol/L (ref 22–32)
Calcium: 9.3 mg/dL (ref 8.9–10.3)
Chloride: 105 mmol/L (ref 101–111)
Creatinine, Ser: 0.83 mg/dL (ref 0.44–1.00)
GFR calc Af Amer: >60 mL/min (ref >60)
GFR calc non Af Amer: >60 mL/min (ref >60)
Glucose, Bld: 88 mg/dL (ref 65–99)
Potassium: 4.1 mmol/L (ref 3.5–5.1)
Sodium: 137 mmol/L (ref 135–145)
Total Bilirubin: 0.6 mg/dL (ref 0.3–1.2)
Total Protein: 6.9 g/dL (ref 6.5–8.1)

## 2017-06-10 LAB — PROTIME-INR
INR: 0.86
Prothrombin Time: 11.7 seconds (ref 11.4–15.2)

## 2017-06-10 LAB — CBC
HCT: 44.9 % (ref 36.0–46.0)
Hemoglobin: 15.2 g/dL — ABNORMAL HIGH (ref 12.0–15.0)
MCH: 31 pg (ref 26.0–34.0)
MCHC: 33.9 g/dL (ref 30.0–36.0)
MCV: 91.4 fL (ref 78.0–100.0)
Platelets: 249 10*3/uL (ref 150–400)
RBC: 4.91 MIL/uL (ref 3.87–5.11)
RDW: 12.9 % (ref 11.5–15.5)
WBC: 5.8 10*3/uL (ref 4.0–10.5)

## 2017-06-10 LAB — I-STAT TROPONIN, ED: Troponin i, poc: 0 ng/mL (ref 0.00–0.08)

## 2017-06-10 LAB — DIFFERENTIAL
Basophils Absolute: 0 10*3/uL (ref 0.0–0.1)
Basophils Relative: 0 %
Eosinophils Absolute: 0.1 10*3/uL (ref 0.0–0.7)
Eosinophils Relative: 2 %
Lymphocytes Relative: 40 %
Lymphs Abs: 2.4 10*3/uL (ref 0.7–4.0)
Monocytes Absolute: 0.4 10*3/uL (ref 0.1–1.0)
Monocytes Relative: 7 %
Neutro Abs: 3 10*3/uL (ref 1.7–7.7)
Neutrophils Relative %: 51 %

## 2017-06-10 LAB — RAPID URINE DRUG SCREEN, HOSP PERFORMED
Amphetamines: NOT DETECTED
Barbiturates: NOT DETECTED
Benzodiazepines: NOT DETECTED
Cocaine: NOT DETECTED
Opiates: NOT DETECTED
Tetrahydrocannabinol: NOT DETECTED

## 2017-06-10 LAB — I-STAT CHEM 8, ED
BUN: 12 mg/dL (ref 6–20)
Calcium, Ion: 1.17 mmol/L (ref 1.15–1.40)
Chloride: 104 mmol/L (ref 101–111)
Creatinine, Ser: 0.8 mg/dL (ref 0.44–1.00)
Glucose, Bld: 84 mg/dL (ref 65–99)
HCT: 47 % — ABNORMAL HIGH (ref 36.0–46.0)
Hemoglobin: 16 g/dL — ABNORMAL HIGH (ref 12.0–15.0)
Potassium: 4 mmol/L (ref 3.5–5.1)
Sodium: 141 mmol/L (ref 135–145)
TCO2: 27 mmol/L (ref 22–32)

## 2017-06-10 LAB — APTT: aPTT: 26 seconds (ref 24–36)

## 2017-06-10 LAB — ETHANOL: Alcohol, Ethyl (B): <10 mg/dL (ref <10)

## 2017-06-10 MED ORDER — LORAZEPAM 2 MG/ML IJ SOLN
1.0000 mg | Freq: Once | INTRAMUSCULAR | Status: DC
  Filled 2017-06-10: qty 1

## 2017-06-10 NOTE — ED Provider Notes (Signed)
After waiting for MRI x several hours, pt refuses MRI.  I offered iv meds/ativan, and discussed w pt - pt continues to refuse MRI.  She indicates symptoms resolved completely, has been eating and drinking, ambulatory in ED.  Pt continues to refuse mri.   As pt remains asymptomatic, continues to refuse further testing, pt currently appears stable for d/c.   Will have f/u w neurology in coming week.  Return precautions provided.      Cathren LaineSteinl, Davidmichael Zarazua, MD 06/10/17 2240

## 2017-06-10 NOTE — ED Notes (Signed)
Call received from MRI regarding patient being unable to tolerate MRI.  Order received for ativan.  Patient brought back from MRI.  Staff states that patient wanted to come back and calm down.  They will attempt scan again after the next one on the list is complete.  To medicate prior to going.

## 2017-06-10 NOTE — Discharge Instructions (Signed)
It was our pleasure to provide your ER care today - we hope that you feel better.  Take an enteric coated aspirin a day.  Follow up with neurologist in the coming week - see referral - call office Monday AM to arrange appointment.  Return to ER right away if worse, new symptoms, trouble speaking, change in vision, one-side of body numbness/weakness, other concern.

## 2017-06-10 NOTE — ED Triage Notes (Signed)
Pt presents for evaluation of dizziness and headache starting around 1100 today. Pt was on phone with daughter during event. Daughter stated to EMS that she was "having trouble finding her words" but denies slurred speech. Pt reports hx of AAA.

## 2017-06-10 NOTE — ED Notes (Signed)
Taken to MRI at this time

## 2017-06-10 NOTE — ED Provider Notes (Signed)
[facility] EMERGENCY DEPARTMENT Provider Note   CSN: [id number] Arrival date & time: 06/10/17  1158     History   Chief Complaint Chief Complaint  Patient presents with  . Dizziness    HPI Kathy [facility] is a 56 y.o. female.  Patient with history of high cholesterol, smoking --presents with transient episodes of confusion and speech difficulties starting last night.  Patient had a period of unknown duration last evening where she states "I could not grasp my thoughts".  She lives alone and did not try to speak.  She does not remember having any weakness or facial droop.  Patient then fell asleep and awoke this morning in her normal state of health.  Late this morning she had a another similar episode of difficulty with her thoughts.  She called her daughter on the phone but had difficulty getting words out.  Her daughter encouraged her to call the ambulance and transported her to the hospital.  Symptoms gradually improved and are currently resolved.  Patient describes a headache today.  No nausea or vomiting.  No recent illness or fevers.  Denies head injuries. The onset of this condition was acute. Aggravating factors: none. Alleviating factors: none.        Past Medical History:  Diagnosis Date  . Abdominal aortic aneurysm (AAA) (HCC)   . Alpha galactosidase deficiency   . Degenerative disc disease   . Depression   . GERD (gastroesophageal reflux disease)   . Head ache     Patient Active Problem List   Diagnosis Date Noted  . High triglycerides 11/16/2013  . 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 Osteoarthritis 03/27/2013  . Depression (emotion) 03/27/2013  . GERD 06/13/2007  . 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 No pertinent surgical history.  OB History    No data available       Home Medications    Prior to Admission medications   Medication Sig Start Date End Date Taking? Authorizing Provider  amitriptyline  (ELAVIL) 25 MG tablet TAKE ONE TABLET BY MOUTH AT BEDTIME 11/15/13   Advani, Ayesha Rumpf, MD  baclofen (LIORESAL) 10 MG tablet Take 1 tablet (10 mg total) by mouth 3 (three) times daily. 01/27/16   Beers, Charmayne Sheer, PA-C  diclofenac (VOLTAREN) 75 MG EC tablet Take 1 tablet (75 mg total) by mouth 2 (two) times daily. 11/15/13   Doris Cheadle, MD  gabapentin (NEURONTIN) 300 MG capsule Take 1 capsule (300 mg total) by mouth 3 (three) times daily. Patient taking differently: Take 300 mg by mouth 3 (three) times daily as needed (pain).  12/17/14 12/17/15  Beers, Charmayne Sheer, PA-C  HYDROcodone-acetaminophen (NORCO) 5-325 MG tablet Take 1-2 tablets by mouth every 4 (four) hours as needed for moderate pain. 01/27/16   Beers, Charmayne Sheer, PA-C  ranitidine (ZANTAC) 75 MG tablet Take 75 mg by mouth daily.    [provider]    Family History No family history on file.  Social History Social History  Substance Use Topics  . Smoking status: Never Smoker  . Smokeless tobacco: Never Used  . Alcohol use 0.5 oz/week    1 Standard drinks or equivalent per week     Allergies   Beef-derived products; Lambs quarters; Pork-derived products; Cyclobenzaprine hcl; Dairy aid [lactase]; Guaifenesin; and Phenylpropanolamine hcl   Review of Systems Review of Systems  Constitutional: Negative for diaphoresis and fever.  HENT: Negative for congestion, dental problem, rhinorrhea and sinus pressure.   Eyes: Negative for  photophobia, discharge, redness and visual disturbance.  Respiratory: Negative for cough and shortness of breath.   Cardiovascular: Negative for chest pain, palpitations and leg swelling.  Gastrointestinal: Negative for abdominal pain, nausea and vomiting.  Genitourinary: Negative for dysuria.  Musculoskeletal: Negative for back pain, gait problem, neck pain and neck stiffness.  Skin: Negative for rash.  Neurological: Positive for speech difficulty and headaches. Negative for syncope, weakness,  light-headedness and numbness.  Psychiatric/Behavioral: Positive for confusion. The patient is not nervous/anxious.      Physical Exam Updated Vital Signs BP (!) 147/88   Pulse 76   Resp 17   SpO2 99%   Physical Exam  Constitutional: She is oriented to person, place, and time. She appears well-developed and well-nourished.  HENT:  Head: Normocephalic and atraumatic.  Right Ear: Tympanic membrane, external ear and ear canal normal.  Left Ear: Tympanic membrane, external ear and ear canal normal.  Nose: Nose normal.  Mouth/Throat: Uvula is midline, oropharynx is clear and moist and mucous membranes are normal.  Eyes: Pupils are equal, round, and reactive to light. Conjunctivae, EOM and lids are normal. Right eye exhibits no nystagmus. Left eye exhibits no nystagmus.  Neck: Normal range of motion. Neck supple.  Cardiovascular: Normal rate and regular rhythm.   Pulmonary/Chest: Effort normal and breath sounds normal.  Abdominal: Soft. There is no tenderness.  Musculoskeletal:       Cervical back: She exhibits normal range of motion, no tenderness and no bony tenderness.  Neurological: She is alert and oriented to person, place, and time. She has normal strength and normal reflexes. No cranial nerve deficit or sensory deficit. She displays a negative Romberg sign. Coordination and gait normal. GCS eye subscore is 4. GCS verbal subscore is 5. GCS motor subscore is 6.  Skin: Skin is warm and dry.  Psychiatric: Her affect is labile.  Nursing note and vitals reviewed.    ED Treatments / Results  Labs (all labs ordered are listed, but only abnormal results are displayed) Labs Reviewed  CBC - Abnormal; Notable for the following:       Result Value   Hemoglobin 15.2 (*)    All other components within normal limits  URINALYSIS, ROUTINE W REFLEX MICROSCOPIC - Abnormal; Notable for the following:    Color, Urine STRAW (*)    Specific Gravity, Urine 1.002 (*)    All other components  within normal limits  I-STAT CHEM 8, ED - Abnormal; Notable for the following:    Hemoglobin 16.0 (*)    HCT 47.0 (*)    All other components within normal limits  ETHANOL  PROTIME-INR  APTT  DIFFERENTIAL  COMPREHENSIVE METABOLIC PANEL  RAPID URINE DRUG SCREEN, HOSP PERFORMED  I-STAT TROPONIN, ED    ED ECG REPORT   Date: 06/10/2017  Rate: 80  Rhythm: normal sinus rhythm  QRS Axis: normal  Intervals: normal  ST/T Wave abnormalities: normal  Conduction Disutrbances:none  Narrative Interpretation:   Old EKG Reviewed: unchanged  I have personally reviewed the EKG tracing and agree with the computerized printout as noted.  Radiology Ct Head Wo Contrast  Result Date: 06/10/2017 CLINICAL DATA:  56 year old female with dizziness and headache. EXAM: CT HEAD WITHOUT CONTRAST TECHNIQUE: Contiguous axial images were obtained from the base of the skull through the vertex without intravenous contrast. COMPARISON:  03/10/2010 FINDINGS: Brain: No evidence of acute infarction, hemorrhage, hydrocephalus, extra-axial collection or mass lesion/mass effect. Vascular: No hyperdense vessel or unexpected calcification. Skull: Normal. Negative for fracture or  focal lesion. Sinuses/Orbits: No acute finding. Other: None. IMPRESSION: Unremarkable CT evaluation of the head. Electronically Signed   By: Sande BrothersSerena  Chacko M.D.   On: 06/10/2017 14:37    Procedures Procedures (including critical care time)  Medications Ordered in ED Medications - No data to display   Initial Impression / Assessment and Plan / ED Course  I have reviewed the triage vital signs and the nursing notes.  Pertinent labs & imaging results that were available during my care of the patient were reviewed by me and considered in my medical decision making (see chart for details).     Patient seen and examined. Work-up initiated. Medications ordered.   Vital signs reviewed and are as follows: BP (!) 147/88   Pulse 76   Resp 17    SpO2 99%   3:05 PM initial workup is reassuring.  Given symptoms concerning for TIA, will request neurology consult and admit to the hospital for TIA evaluation.  Patient discussed with and seen by Dr. Fayrene FearingJames.   4:01 PM Spoke with Dr. Wilford CornerArora of neurology.  Plan is to obtain MRI.  Neurology requests call once MRI results.  Plan is for likely discharge to home with outpatient follow-up if MRI is negative.  Patient and daughter updated on plan.  Questions answered.   Final Clinical Impressions(s) / ED Diagnoses   Final diagnoses:  Difficulty with speech   Pending completion of eval with MRI. Pt updated.    New Prescriptions New Prescriptions   No medications on file     Renne CriglerGeiple, Giovany Cosby, Cordelia Poche-C 06/10/17 1611    Rolland PorterJames, Mark, MD 06/20/17 [phone]

## 2017-06-10 NOTE — ED Notes (Signed)
Patient transported to CT 

## 2017-06-10 NOTE — ED Notes (Signed)
Called into room.  States I don't think I can do the MRI.  Encouraged to take ativan.  States I just don't think I can do it.  Physician made aware.  John from MRI made aware as well.

## 2017-08-17 ENCOUNTER — Ambulatory Visit: Payer: Self-pay | Admitting: Neurology

## 2017-08-31 ENCOUNTER — Ambulatory Visit
Admission: RE | Admit: 2017-08-31 | Discharge: 2017-08-31 | Disposition: A | Discharge status: Home / Self Care | Payer: Self-pay | Source: Ambulatory Visit | Attending: Oncology | Admitting: Oncology

## 2017-08-31 ENCOUNTER — Ambulatory Visit: Payer: Self-pay | Attending: Oncology

## 2017-08-31 VITALS — BP 129/87 | HR 77 | Temp 98.8°F | Ht 66.0 in | Wt 237.0 lb

## 2017-08-31 DIAGNOSIS — Z0000 Encounter for general adult medical examination without abnormal findings: Secondary | ICD-10-CM

## 2017-08-31 NOTE — Progress Notes (Signed)
Subjective:     Patient ID: [facility], female   DOB: 05/04/1961, 57 y.o.   MRN: [id number]  HPI   Review of Systems     Objective:   Physical Exam  Pulmonary/Chest: Right breast exhibits no inverted nipple, no mass, no nipple discharge, no skin change and no tenderness. Left breast exhibits no inverted nipple, no mass, no nipple discharge, no skin change and no tenderness. Breasts are symmetrical.       Assessment:     57 [facility].  Patient has history of osteoarthristis, and alpha-gal syndrome. Patient screened, and meets [facility] eligibility.  Patient does not have insurance, Medicare or Medicaid.  Handout given on Affordable Care Act. Instructed patient on breast self awareness using teach back method.  CBE unremarkable.  No mass or lump palpated.  Pelvic exam normal.    Plan:     Sent for bilateral screening mammogram.  Specimen collected for pap.

## 2017-09-01 LAB — PAP LB AND HPV HIGH-RISK
HPV, high-risk: NEGATIVE
PAP Smear Comment: 0

## 2017-09-12 NOTE — Progress Notes (Signed)
Letter mailed from [facility] to notify of normal mammogram results.  Patient to return in one year for annual screening.  Copy to [facility]. 

## 2017-11-07 DIAGNOSIS — M7750 Other enthesopathy of unspecified foot: Secondary | ICD-10-CM | POA: Insufficient documentation

## 2017-11-07 DIAGNOSIS — S86019A Strain of unspecified Achilles tendon, initial encounter: Secondary | ICD-10-CM | POA: Insufficient documentation

## 2018-01-04 DIAGNOSIS — M47816 Spondylosis without myelopathy or radiculopathy, lumbar region: Secondary | ICD-10-CM | POA: Insufficient documentation

## 2018-01-04 DIAGNOSIS — M79671 Pain in right foot: Secondary | ICD-10-CM | POA: Insufficient documentation

## 2018-01-04 DIAGNOSIS — E78019 Familial hypercholesterolemia, unspecified: Secondary | ICD-10-CM | POA: Insufficient documentation

## 2018-01-04 DIAGNOSIS — E7801 Familial hypercholesterolemia: Secondary | ICD-10-CM | POA: Insufficient documentation

## 2018-01-04 DIAGNOSIS — M159 Polyosteoarthritis, unspecified: Secondary | ICD-10-CM | POA: Insufficient documentation

## 2018-01-04 DIAGNOSIS — M5030 Other cervical disc degeneration, unspecified cervical region: Secondary | ICD-10-CM | POA: Insufficient documentation

## 2018-01-10 ENCOUNTER — Emergency Department (HOSPITAL_COMMUNITY)
Admission: EM | Admit: 2018-01-10 | Discharge: 2018-01-10 | Discharge status: Against Medical Advice | Payer: Medicare Other

## 2018-01-10 NOTE — ED Notes (Signed)
Pt called in lobby for triage x3. No response.

## 2018-01-10 NOTE — ED Notes (Signed)
Writer call for triage X3

## 2018-01-10 NOTE — ED Notes (Signed)
Called pt's name in lobby for triage, unable to locate.

## 2018-01-10 NOTE — ED Notes (Signed)
Writer call X1 no response.

## 2018-01-11 ENCOUNTER — Encounter (HOSPITAL_COMMUNITY): Admission: RE | Payer: Self-pay | Source: Ambulatory Visit

## 2018-01-11 ENCOUNTER — Ambulatory Visit (HOSPITAL_COMMUNITY): Admission: RE | Admit: 2018-01-11 | Payer: Self-pay | Source: Ambulatory Visit | Admitting: Oral Surgery

## 2018-01-11 SURGERY — DENTAL RESTORATION/EXTRACTION WITH X-RAY
Anesthesia: General

## 2018-02-03 DIAGNOSIS — Z8673 Personal history of transient ischemic attack (TIA), and cerebral infarction without residual deficits: Secondary | ICD-10-CM | POA: Insufficient documentation

## 2018-02-03 DIAGNOSIS — F17200 Nicotine dependence, unspecified, uncomplicated: Secondary | ICD-10-CM | POA: Insufficient documentation

## 2018-02-11 ENCOUNTER — Emergency Department: Payer: Medicare Other

## 2018-02-11 ENCOUNTER — Emergency Department
Admission: EM | Admit: 2018-02-11 | Discharge: 2018-02-11 | Disposition: A | Discharge status: Home / Self Care | Payer: Medicare Other | Attending: Emergency Medicine | Admitting: Emergency Medicine

## 2018-02-11 ENCOUNTER — Other Ambulatory Visit: Payer: Self-pay

## 2018-02-11 ENCOUNTER — Encounter: Payer: Self-pay | Admitting: Emergency Medicine

## 2018-02-11 DIAGNOSIS — R51 Headache: Secondary | ICD-10-CM | POA: Diagnosis not present

## 2018-02-11 DIAGNOSIS — M79605 Pain in left leg: Secondary | ICD-10-CM | POA: Diagnosis not present

## 2018-02-11 DIAGNOSIS — Z79899 Other long term (current) drug therapy: Secondary | ICD-10-CM | POA: Insufficient documentation

## 2018-02-11 DIAGNOSIS — R519 Headache, unspecified: Secondary | ICD-10-CM

## 2018-02-11 LAB — BASIC METABOLIC PANEL
Anion gap: 6 (ref 5–15)
BUN: 18 mg/dL (ref 6–20)
CO2: 30 mmol/L (ref 22–32)
Calcium: 9.1 mg/dL (ref 8.9–10.3)
Chloride: 106 mmol/L (ref 98–111)
Creatinine, Ser: 0.8 mg/dL (ref 0.44–1.00)
GFR calc Af Amer: >60 mL/min (ref >60)
GFR calc non Af Amer: >60 mL/min (ref >60)
Glucose, Bld: 109 mg/dL — ABNORMAL HIGH (ref 70–99)
Potassium: 4.1 mmol/L (ref 3.5–5.1)
Sodium: 142 mmol/L (ref 135–145)

## 2018-02-11 LAB — CBC WITH DIFFERENTIAL/PLATELET
Basophils Absolute: 0 10*3/uL (ref 0–0.1)
Basophils Relative: 1 %
Eosinophils Absolute: 0.1 10*3/uL (ref 0–0.7)
Eosinophils Relative: 2 %
HCT: 41.9 % (ref 35.0–47.0)
Hemoglobin: 14.5 g/dL (ref 12.0–16.0)
Lymphocytes Relative: 35 %
Lymphs Abs: 1.7 10*3/uL (ref 1.0–3.6)
MCH: 32 pg (ref 26.0–34.0)
MCHC: 34.6 g/dL (ref 32.0–36.0)
MCV: 92.5 fL (ref 80.0–100.0)
Monocytes Absolute: 0.3 10*3/uL (ref 0.2–0.9)
Monocytes Relative: 7 %
Neutro Abs: 2.6 10*3/uL (ref 1.4–6.5)
Neutrophils Relative %: 55 %
Platelets: 231 10*3/uL (ref 150–440)
RBC: 4.53 MIL/uL (ref 3.80–5.20)
RDW: 13.1 % (ref 11.5–14.5)
WBC: 4.8 10*3/uL (ref 3.6–11.0)

## 2018-02-11 LAB — CK: Total CK: 72 U/L (ref 38–234)

## 2018-02-11 MED ORDER — SODIUM CHLORIDE 0.9 % IV BOLUS
1000.0000 mL | Freq: Once | INTRAVENOUS | Status: AC
  Administered 2018-02-11: 1000 mL via INTRAVENOUS

## 2018-02-11 MED ORDER — KETOROLAC TROMETHAMINE 30 MG/ML IJ SOLN
30.0000 mg | Freq: Once | INTRAMUSCULAR | Status: AC
  Administered 2018-02-11: 30 mg via INTRAVENOUS
  Filled 2018-02-11: qty 1

## 2018-02-11 MED ORDER — TRAMADOL HCL 50 MG PO TABS: 50.0000 mg | ORAL_TABLET | Freq: Four times a day (QID) | ORAL | 0 refills | Status: DC | PRN

## 2018-02-11 MED ORDER — PREDNISONE 10 MG (21) PO TBPK: ORAL_TABLET | Freq: Every day | ORAL | 0 refills | Status: DC

## 2018-02-11 NOTE — ED Notes (Signed)
Report to Piedad Climesavid W. RN

## 2018-02-11 NOTE — ED Provider Notes (Signed)
[facility] Emergency Department Provider Note  ___________________________________________   First MD Initiated Contact with Patient 02/11/18 1359     (approximate)  I have reviewed the triage vital signs and the nursing notes.   HISTORY  Chief Complaint Headache and Leg Pain  HPI Kathy [facility] is a 57 y.o. female with a history of AAA, osteoarthritis and disc herniation was presenting with 24 hours of intermittent left thigh pain.  Says the pain is a shooting pain that is to the anterior left thigh.  She has no pain at this time.  Says that movement does not affect the pain.  Denies any back pain.  Denies any urinary symptoms.  Denies any recent increase in usage or heavy lifting.  Denies any injury.  Says that she, several weeks ago, had a similar pain in her right upper extremity that has since resolved.  She says that she thought that this pain was related to taking a magnesium tablet so she stopped taking the supplements.  She is also reporting a headache that is a 4 out of 10 and frontal and feels like a "tightness."  She is not reporting any change in vision.  No nausea or vomiting.  Past Medical History:  Diagnosis Date  . Abdominal aortic aneurysm (AAA) (HCC)   . Alpha galactosidase deficiency   . Degenerative disc disease   . Depression   . GERD (gastroesophageal reflux disease)   . Head ache     Patient Active Problem List   Diagnosis Date Noted  . High triglycerides 11/16/2013  . 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 Osteoarthritis 03/27/2013  . Depression (emotion) 03/27/2013  . GERD 06/13/2007  . 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 No pertinent surgical history.  Prior to Admission medications   Medication Sig Start Date End Date Taking? Authorizing Provider  amitriptyline (ELAVIL) 25 MG tablet TAKE ONE TABLET BY MOUTH AT BEDTIME 11/15/13   Advani, Ayesha Rumpf, MD  baclofen (LIORESAL) 10 MG tablet Take 1 tablet (10 mg  total) by mouth 3 (three) times daily. Patient not taking: Reported on 06/10/2017 01/27/16   Beers, Charmayne Sheer, PA-C  diclofenac (VOLTAREN) 75 MG EC tablet Take 1 tablet (75 mg total) by mouth 2 (two) times daily. 11/15/13   Doris Cheadle, MD  gabapentin (NEURONTIN) 300 MG capsule Take 1 capsule (300 mg total) by mouth 3 (three) times daily. Patient taking differently: Take 300 mg by mouth as needed (pain).  12/17/14 06/10/17  Beers, Charmayne Sheer, PA-C  HYDROcodone-acetaminophen (NORCO) 5-325 MG tablet Take 1-2 tablets by mouth every 4 (four) hours as needed for moderate pain. Patient not taking: Reported on 06/10/2017 01/27/16   Evangeline Dakin, PA-C  oxyCODONE-acetaminophen (PERCOCET/ROXICET) 5-325 MG tablet Take 1 tablet by mouth at bedtime.    [provider]  ranitidine (ZANTAC) 75 MG tablet Take 75 mg by mouth daily.    [provider]    Allergies Beef-derived products; Lambs quarters; Pork-derived products; Cyclobenzaprine hcl; Guaifenesin; and Phenylpropanolamine hcl  No family history on file.  Social History Social History   Tobacco Use  . Smoking status: Never Smoker  . Smokeless tobacco: Never Used  Substance Use Topics  . Alcohol use: Yes    Alcohol/week: 0.6 oz    Types: 1 Standard drinks or equivalent per week  . Drug use: No    Review of Systems  Constitutional: No fever/chills Eyes: No visual changes. ENT: No sore throat. Cardiovascular: Denies chest pain. Respiratory: Denies  shortness of breath. Gastrointestinal: No abdominal pain.  No nausea, no vomiting.  No diarrhea.  No constipation. Genitourinary: Negative for dysuria. Musculoskeletal: Negative for back pain. Skin: Negative for rash. Neurological: Negative for focal weakness or numbness.   ____________________________________________   PHYSICAL EXAM:  VITAL SIGNS: ED Triage Vitals  Enc Vitals Group     BP 02/11/18 1322 122/60     Pulse Rate 02/11/18 1322 84     Resp 02/11/18 1322 16      Temp 02/11/18 1322 98.5 F (36.9 C)     Temp Source 02/11/18 1322 Oral     SpO2 02/11/18 1322 95 %     Weight 02/11/18 1321 240 lb (108.9 kg)     Height 02/11/18 1321 5\' 6"  (1.676 m)     Head Circumference --      Peak Flow --      Pain Score 02/11/18 1321 4     Pain Loc --      Pain Edu? --      Excl. in GC? --     Constitutional: Alert and oriented. Well appearing and in no acute distress. Eyes: Conjunctivae are normal.  Head: Atraumatic. Nose: No congestion/rhinnorhea. Mouth/Throat: Mucous membranes are moist.  Neck: No stridor.   Cardiovascular: Normal rate, regular rhythm. Grossly normal heart sounds.  Good peripheral circulation. Respiratory: Normal respiratory effort.  No retractions. Lungs CTAB. Gastrointestinal: Soft and nontender. No distention. No CVA tenderness. Musculoskeletal: No lower extremity tenderness nor edema.  No joint effusions.  No tenderness to palpation, swelling, erythema to the bilateral lower extremity's.  Dorsalis pedis pulses are present and equal bilaterally. Neurologic:  Normal speech and language. No gross focal neurologic deficits are appreciated. Skin:  Skin is warm, dry and intact. No rash noted. Psychiatric: Mood and affect are normal. Speech and behavior are normal.  ____________________________________________   LABS (all labs ordered are listed, but only abnormal results are displayed)  Labs Reviewed  CBC WITH DIFFERENTIAL/PLATELET  BASIC METABOLIC PANEL  CK   ____________________________________________  EKG   ____________________________________________  RADIOLOGY  No acute finding on the DVT ultrasound of the right lower extremity ____________________________________________   PROCEDURES  Procedure(s) performed:   Procedures  Critical Care performed:   ____________________________________________   INITIAL IMPRESSION / ASSESSMENT AND PLAN / ED COURSE  Pertinent labs & imaging results that were available  during my care of the patient were reviewed by me and considered in my medical decision making (see chart for details).  DDX:  paresthesia, muscle strain, neuropathy, DVT, tension headache, muscle cramps, rhabdomyolysis, renal failure. As part of my medical decision making, I reviewed the following data within the electronic MEDICAL RECORD NUMBER Notes from prior ED visits  ----------------------------------------- 3:31 PM on 02/11/2018 -----------------------------------------  Patient with benign appearance.  Pending lab results at this time.  Patient with reassuring neurologic exam.  Normal CAT scan of November 2018.  Unlikely to be secondary cephalgia.  No sudden onset headache or worst headache of life.  Also, pain is not out of proportion to exam.  There is no crepitus.  No signs of serious deep space infection.  signed out to Dr. Mayford KnifeWilliams. ____________________________________________   FINAL CLINICAL IMPRESSION(S) / ED DIAGNOSES  Left lower extremity pain.  Headache.  NEW MEDICATIONS STARTED DURING THIS VISIT:  New Prescriptions   No medications on file     Note:  This document was prepared using Dragon voice recognition software and may include unintentional dictation errors.     Gladstone PihSchaevitz, David  Molli Hazard, MD 02/11/18 1534

## 2018-02-11 NOTE — ED Triage Notes (Addendum)
Arrives with c/o left thigh pain, initiates just above knee and shoots up. Describes pain as fleeting and intermittent.  States she also has noticed similar pain to left forearm today.  Patient has history of arthritis and degenerative disc disease.  Today c/o headache to forehead and upper face.  Patient is AAOx3.  Skin warm and dry. NAD.  MAE equally and strong.

## 2018-02-11 NOTE — ED Notes (Signed)
Pt returned from U/S via stretcher. 

## 2018-02-11 NOTE — ED Provider Notes (Signed)
Patient is in no distress, unclear etiology for her pain.  There is a chance this is radiculopathy.  She will be discharged with steroids and pain medicine.   [name], Jonathan E, MD 02/11/18 [phone]

## 2018-02-17 DIAGNOSIS — Z79891 Long term (current) use of opiate analgesic: Secondary | ICD-10-CM | POA: Insufficient documentation

## 2018-02-17 DIAGNOSIS — M2550 Pain in unspecified joint: Secondary | ICD-10-CM | POA: Insufficient documentation

## 2018-02-17 DIAGNOSIS — G894 Chronic pain syndrome: Secondary | ICD-10-CM | POA: Insufficient documentation

## 2018-02-17 DIAGNOSIS — M47812 Spondylosis without myelopathy or radiculopathy, cervical region: Secondary | ICD-10-CM | POA: Insufficient documentation

## 2018-03-15 ENCOUNTER — Other Ambulatory Visit: Payer: Self-pay

## 2018-03-15 ENCOUNTER — Encounter (HOSPITAL_COMMUNITY): Payer: Self-pay | Admitting: *Deleted

## 2018-03-15 NOTE — H&P (Signed)
HISTORY AND PHYSICAL  Iran SizerRebecca L Burnstein is a 57 y.o. female patient referred by general dentist for multiple dental extractions.   No diagnosis found.  Past Medical History:  Diagnosis Date  . Abdominal aortic aneurysm (AAA) (HCC)   . Alpha galactosidase deficiency   . Degenerative disc disease   . Depression   . GERD (gastroesophageal reflux disease)   . Head ache     No current facility-administered medications for this encounter.    Current Outpatient Medications  Medication Sig Dispense Refill  . amitriptyline (ELAVIL) 25 MG tablet TAKE ONE TABLET BY MOUTH AT BEDTIME (Patient taking differently: Take 25 mg by mouth at bedtime. ) 30 tablet 3  . ARNICA EX Apply 1 application topically daily as needed (for pain).    . Coenzyme Q10 (CO Q-10) 100 MG CAPS Take 100 mg by mouth daily.    . diclofenac (VOLTAREN) 75 MG EC tablet Take 1 tablet (75 mg total) by mouth 2 (two) times daily. 60 tablet 3  . gabapentin (NEURONTIN) 300 MG capsule Take 1 capsule (300 mg total) by mouth 3 (three) times daily. (Patient taking differently: Take 300 mg by mouth at bedtime. ) 90 capsule 0  . GLUCOSAMINE-CHONDROITIN PO Take 1 tablet by mouth daily.    . Lecithin 1200 MG CAPS Take 1,200 mg by mouth daily.    Marland Kitchen. omeprazole (PRILOSEC) 20 MG capsule Take 20 mg by mouth daily.    . traMADol (ULTRAM) 50 MG tablet Take 1 tablet (50 mg total) by mouth every 6 (six) hours as needed. (Patient taking differently: Take 50 mg by mouth See admin instructions. Take 100 mg by mouth at bedtime. Take 50 mg by mouth 3 times daily as needed for pain) 20 tablet 0  . trolamine salicylate (ASPERCREME) 10 % cream Apply 1 application topically daily as needed for muscle pain.    Marland Kitchen. aspirin EC 81 MG tablet Take 81 mg by mouth daily.    . baclofen (LIORESAL) 10 MG tablet Take 1 tablet (10 mg total) by mouth 3 (three) times daily. (Patient not taking: Reported on 06/10/2017) 30 tablet 0  . HYDROcodone-acetaminophen (NORCO) 5-325 MG tablet  Take 1-2 tablets by mouth every 4 (four) hours as needed for moderate pain. (Patient not taking: Reported on 06/10/2017) 8 tablet 0   Allergies  Allergen Reactions  . Beef-Derived Products Anaphylaxis  . Lambs Quarters Anaphylaxis  . Pork-Derived Products Anaphylaxis  . Cyclobenzaprine Hcl Other (See Comments)    Headache  . Guaifenesin Other (See Comments)    Every nerve in body was on edge   . Phenylpropanolamine Hcl Swelling   Active Problems:   * No active hospital problems. *  Vitals: There were no vitals taken for this visit. Lab results:No results found for this or any previous visit (from the past 24 hour(s)). Radiology Results: No results found. General appearance: alert, cooperative and morbidly obese Head: Normocephalic, without obvious abnormality, atraumatic Eyes: negative Nose: Nares normal. Septum midline. Mucosa normal. No drainage or sinus tenderness. Throat: edentulous maxilla. carious teeth 21 -28. Pharynx clear Neck: no adenopathy, supple, symmetrical, trachea midline and thyroid not enlarged, symmetric, no tenderness/mass/nodules Resp: clear to auscultation bilaterally Cardio: regular rate and rhythm, S1, S2 normal, no murmur, click, rub or gallop  Assessment: 57 f morbid obesity, DDD, pain management, abdominal aortic aneurysm with nonrestorable teeth 21-28.  Plan: Multiple dental extractions with alveoloplasty. GA. Day surgery.   Ocie DoyneScott Emmit Oriley 03/15/2018

## 2018-03-15 NOTE — Progress Notes (Signed)
Anesthesia Chart Review: Maury Dus  Case:  [id number] Date/Time:  03/17/18 0715   Procedure:  DENTAL RESTORATION/EXTRACTIONS (Bilateral )   Anesthesia type:  General   Pre-op diagnosis:  NONRESTORABLE   Location:  [facility] ROOM 10 / [facility]   Surgeon:  Ocie Doyne, DDS      DISCUSSION: Patient is a 57 year old female scheduled for the above procedure. History includes smoking, GERD, hiatal hernia, neuropathy, alpha gal allergy (which I confirmed with her). She reported history of AAA. In talking with her and review of available imaging, it appears that she was noted to have an ectatic abdominal aorta (26 mm) by 2016 CT with ultrasound follow-up recommended in 5 year. She reported that she had an abdominal ultrasound through [facility] last year that did not see a AAA which has left her confused regarding follow-up. She was encouraged to follow-up with her PCP to clarify future follow-up recommendations. She recently moved, but if she finds the abdominal U/S report then she will plan to bring with her on the day of surgery. She denied abdominal or back pain. She denied any known cardiac history.  If no acute changes then I anticipate that she can proceed as planned.   VS: For day of surgery.   PROVIDERS: Maud Deed, PA is listed as PCP ([facility], see Care Everywhere).   LABS: She is for updated labs on the day of surgery. Most recent labs show: Lab Results  Component Value Date   WBC 4.8 02/11/2018   HGB 14.5 02/11/2018   HCT 41.9 02/11/2018   PLT 231 02/11/2018   GLUCOSE 109 (H) 02/11/2018   ALT 19 06/10/2017   AST 28 06/10/2017   NA 142 02/11/2018   K 4.1 02/11/2018   CL 106 02/11/2018   CREATININE 0.80 02/11/2018   BUN 18 02/11/2018   CO2 30 02/11/2018    IMAGES: CXR 06/06/17: IMPRESSION: Chronic bronchitic markings.  No acute findings.  EKG: 06/10/17: SR.   CV: CT abd/pelvis 12/16/14: IMPRESSION: 1. No acute intra-abdominal findings. 2. Colonic  diverticulosis. 3. Cholelithiasis. 4. Ectatic abdominal aorta (26 mm), new from 2011, at risk for aneurysm development. Recommend followup by ultrasound in 5 years. This recommendation follows ACR consensus guidelines: White Paper of the ACR Incidental Findings Committee II on Vascular Findings. J Am Coll Radiol 2013; 10:789-794.  She reported a Water [employer] Screen last year did not see an abdominal aneurysm.   Past Medical History:  Diagnosis Date  . Abdominal aortic aneurysm (AAA) (HCC)   . Allergy to alpha-gal   . Degenerative disc disease   . Depression    pt denies  . GERD (gastroesophageal reflux disease)   . Head ache   . History of hiatal hernia    years ago  . Neuropathy   . Stroke [facility])    TIA    Past Surgical History:  Procedure Laterality Date  . MOUTH SURGERY      MEDICATIONS: No current facility-administered medications for this encounter.    Marland Kitchen amitriptyline (ELAVIL) 25 MG tablet  . ARNICA EX  . Coenzyme Q10 (CO Q-10) 100 MG CAPS  . diclofenac (VOLTAREN) 75 MG EC tablet  . gabapentin (NEURONTIN) 300 MG capsule  . GLUCOSAMINE-CHONDROITIN PO  . Lecithin 1200 MG CAPS  . omeprazole (PRILOSEC) 20 MG capsule  . traMADol (ULTRAM) 50 MG tablet  . trolamine salicylate (ASPERCREME) 10 % cream  . aspirin EC 81 MG tablet  . baclofen (LIORESAL) 10 MG  tablet  . HYDROcodone-acetaminophen (NORCO) 5-325 MG tablet    Velna Ochsllison Kristine Tiley, PA-C [facility]iology Phone [phone] 03/15/2018 2:30 PM

## 2018-03-15 NOTE — Progress Notes (Signed)
Spoke with pt for pre-op call. Pt denies cardiac history, HTN or Diabetes. Asked pt about having an Abdominal Aortic Aneurysm that was listed in her medical history. She states she had a CT scan or MRI about 3 years ago and was an incidental finding. She states she never followed up with anyone about, but "worried" about it. She states last year she had a [facility] screening done and they told her that they didn't see an aneurysm, she states "I'm really confused now". She states it was done at [facility]. I found results from CT scan in 2016 that read "Ectatic abdominal aorta, new from 2011, at risk for aneurysm development. Recommend followup by ultrasound in 5 years". Will have Anesthesilogy PA to review chart.   Pt also stated that she had a TIA "sometime last year". She states no lasting symptoms. I asked her if she followed up with a neurologist and she stated she did not because she was dealing with a lot of other issues, mainly she couldn't afford to go. That visit to [facility] ED [facility] was in November, 2018 and pt refused to have the MRI.   Pt stopped her 81 mg Aspirin over a month ago because she bled a lot after having blood drawn. I instructed pt to stop her Lecithin, Glucosamin and Voltaren as of today prior to surgery.

## 2018-03-16 NOTE — Anesthesia Preprocedure Evaluation (Addendum)
Anesthesia Evaluation  Patient identified by MRN, date of birth, ID band Patient awake    Reviewed: Allergy & Precautions, H&P , NPO status , Patient's Chart, lab work & pertinent test results, reviewed documented beta blocker date and time   Airway Mallampati: II  TM Distance: >3 FB Neck ROM: full    Dental no notable dental hx.    Pulmonary neg pulmonary ROS, Current Smoker,    Pulmonary exam normal breath sounds clear to auscultation       Cardiovascular Exercise Tolerance: Good negative cardio ROS   Rhythm:regular Rate:Normal     Neuro/Psych negative neurological ROS  negative psych ROS   GI/Hepatic Neg liver ROS, hiatal hernia,   Endo/Other  negative endocrine ROS  Renal/GU negative Renal ROS  negative genitourinary   Musculoskeletal  (+) Arthritis , Osteoarthritis,    Abdominal   Peds  Hematology negative hematology ROS (+)   Anesthesia Other Findings Abdominal aortic aneurysm  Alpha galactosidase deficiency Degenerative disc disease  Depression  GERD   Head ache     Reproductive/Obstetrics negative OB ROS                            Anesthesia Physical Anesthesia Plan  ASA: III  Anesthesia Plan: General   Post-op Pain Management:    Induction:   PONV Risk Score and Plan: 3 and Ondansetron, Dexamethasone and Treatment may vary due to age or medical condition  Airway Management Planned: Oral ETT  Additional Equipment:   Intra-op Plan:   Post-operative Plan:   Informed Consent: I have reviewed the patients History and Physical, chart, labs and discussed the procedure including the risks, benefits and alternatives for the proposed anesthesia with the patient or authorized representative who has indicated his/her understanding and acceptance.   Dental Advisory Given  Plan Discussed with: CRNA, Anesthesiologist and Surgeon  Anesthesia Plan Comments:         Anesthesia Quick Evaluation

## 2018-03-17 ENCOUNTER — Ambulatory Visit (HOSPITAL_COMMUNITY): Payer: Medicare Other | Admitting: Vascular Surgery

## 2018-03-17 ENCOUNTER — Encounter (HOSPITAL_COMMUNITY)
Admission: RE | Disposition: A | Discharge status: Home / Self Care | Payer: Self-pay | Source: Ambulatory Visit | Attending: Oral Surgery

## 2018-03-17 ENCOUNTER — Ambulatory Visit (HOSPITAL_COMMUNITY)
Admission: RE | Admit: 2018-03-17 | Discharge: 2018-03-17 | Disposition: A | Discharge status: Home / Self Care | Payer: Medicare Other | Source: Ambulatory Visit | Attending: Oral Surgery | Admitting: Oral Surgery

## 2018-03-17 DIAGNOSIS — Z6839 Body mass index (BMI) 39.0-39.9, adult: Secondary | ICD-10-CM | POA: Diagnosis not present

## 2018-03-17 DIAGNOSIS — I714 Abdominal aortic aneurysm, without rupture: Secondary | ICD-10-CM | POA: Diagnosis not present

## 2018-03-17 DIAGNOSIS — K029 Dental caries, unspecified: Secondary | ICD-10-CM | POA: Insufficient documentation

## 2018-03-17 HISTORY — DX: Personal history of other diseases of the digestive system: Z87.19

## 2018-03-17 HISTORY — DX: Cerebral infarction, unspecified: I63.9

## 2018-03-17 HISTORY — PX: TOOTH EXTRACTION: SHX859

## 2018-03-17 HISTORY — DX: Allergy to other foods: Z91.018

## 2018-03-17 HISTORY — PX: MOUTH SURGERY: SHX715

## 2018-03-17 HISTORY — DX: Polyneuropathy, unspecified: G62.9

## 2018-03-17 LAB — CBC
HCT: 42.2 % (ref 36.0–46.0)
Hemoglobin: 14.4 g/dL (ref 12.0–15.0)
MCH: 31 pg (ref 26.0–34.0)
MCHC: 34.1 g/dL (ref 30.0–36.0)
MCV: 90.9 fL (ref 78.0–100.0)
Platelets: 242 10*3/uL (ref 150–400)
RBC: 4.64 MIL/uL (ref 3.87–5.11)
RDW: 12.1 % (ref 11.5–15.5)
WBC: 9.4 10*3/uL (ref 4.0–10.5)

## 2018-03-17 LAB — BASIC METABOLIC PANEL
Anion gap: 10 (ref 5–15)
BUN: 18 mg/dL (ref 6–20)
CO2: 26 mmol/L (ref 22–32)
Calcium: 9.6 mg/dL (ref 8.9–10.3)
Chloride: 103 mmol/L (ref 98–111)
Creatinine, Ser: 0.84 mg/dL (ref 0.44–1.00)
GFR calc Af Amer: >60 mL/min (ref >60)
GFR calc non Af Amer: >60 mL/min (ref >60)
Glucose, Bld: 156 mg/dL — ABNORMAL HIGH (ref 70–99)
Potassium: 4.4 mmol/L (ref 3.5–5.1)
Sodium: 139 mmol/L (ref 135–145)

## 2018-03-17 SURGERY — DENTAL RESTORATION/EXTRACTIONS
Anesthesia: General | Site: Mouth

## 2018-03-17 MED ORDER — ONDANSETRON HCL 4 MG/2ML IJ SOLN: 4.0000 mg | Freq: Once | INTRAMUSCULAR | Status: DC | PRN

## 2018-03-17 MED ORDER — MEPERIDINE HCL 50 MG/ML IJ SOLN: 6.2500 mg | INTRAMUSCULAR | Status: DC | PRN

## 2018-03-17 MED ORDER — CEFAZOLIN SODIUM-DEXTROSE 2-4 GM/100ML-% IV SOLN
2.0000 g | INTRAVENOUS | Status: AC
  Administered 2018-03-17: 2 g via INTRAVENOUS
  Filled 2018-03-17: qty 100

## 2018-03-17 MED ORDER — LIDOCAINE-EPINEPHRINE 2 %-1:100000 IJ SOLN
INTRAMUSCULAR | Status: AC
  Filled 2018-03-17: qty 1

## 2018-03-17 MED ORDER — ROCURONIUM BROMIDE 10 MG/ML (PF) SYRINGE
PREFILLED_SYRINGE | INTRAVENOUS | Status: AC
  Filled 2018-03-17: qty 10

## 2018-03-17 MED ORDER — SODIUM CHLORIDE 0.9 % IR SOLN
Status: DC | PRN
  Administered 2018-03-17: 1000 mL

## 2018-03-17 MED ORDER — LIDOCAINE-EPINEPHRINE 2 %-1:100000 IJ SOLN
INTRAMUSCULAR | Status: DC | PRN
  Administered 2018-03-17: 8 mL

## 2018-03-17 MED ORDER — ONDANSETRON HCL 4 MG/2ML IJ SOLN
INTRAMUSCULAR | Status: AC
  Filled 2018-03-17: qty 2

## 2018-03-17 MED ORDER — OXYMETAZOLINE HCL 0.05 % NA SOLN
NASAL | Status: AC
  Filled 2018-03-17: qty 15

## 2018-03-17 MED ORDER — ONDANSETRON HCL 4 MG/2ML IJ SOLN
INTRAMUSCULAR | Status: DC | PRN
  Administered 2018-03-17: 4 mg via INTRAVENOUS

## 2018-03-17 MED ORDER — OXYCODONE HCL 5 MG/5ML PO SOLN: 5.0000 mg | Freq: Once | ORAL | Status: DC | PRN

## 2018-03-17 MED ORDER — OXYCODONE HCL 5 MG PO TABS: 5.0000 mg | ORAL_TABLET | Freq: Once | ORAL | Status: DC | PRN

## 2018-03-17 MED ORDER — MIDAZOLAM HCL 2 MG/2ML IJ SOLN
INTRAMUSCULAR | Status: AC
  Filled 2018-03-17: qty 2

## 2018-03-17 MED ORDER — OXYMETAZOLINE HCL 0.05 % NA SOLN
NASAL | Status: DC | PRN
  Administered 2018-03-17: 1

## 2018-03-17 MED ORDER — OXYCODONE-ACETAMINOPHEN 5-325 MG PO TABS: 1.0000 | ORAL_TABLET | ORAL | 0 refills | Status: DC | PRN

## 2018-03-17 MED ORDER — CEFAZOLIN SODIUM-DEXTROSE 2-4 GM/100ML-% IV SOLN
INTRAVENOUS | Status: AC
  Filled 2018-03-17: qty 100

## 2018-03-17 MED ORDER — SUGAMMADEX SODIUM 200 MG/2ML IV SOLN
INTRAVENOUS | Status: DC | PRN
  Administered 2018-03-17: 200 mg via INTRAVENOUS

## 2018-03-17 MED ORDER — ACETAMINOPHEN 325 MG PO TABS: 325.0000 mg | ORAL_TABLET | ORAL | Status: DC | PRN

## 2018-03-17 MED ORDER — FENTANYL CITRATE (PF) 250 MCG/5ML IJ SOLN
INTRAMUSCULAR | Status: AC
  Filled 2018-03-17: qty 5

## 2018-03-17 MED ORDER — AMOXICILLIN 500 MG PO CAPS: 500.0000 mg | ORAL_CAPSULE | Freq: Three times a day (TID) | ORAL | 0 refills | Status: DC

## 2018-03-17 MED ORDER — LACTATED RINGERS IV SOLN
INTRAVENOUS | Status: DC | PRN
  Administered 2018-03-17 (×2): via INTRAVENOUS

## 2018-03-17 MED ORDER — ACETAMINOPHEN 160 MG/5ML PO SOLN: 325.0000 mg | ORAL | Status: DC | PRN

## 2018-03-17 MED ORDER — FENTANYL CITRATE (PF) 100 MCG/2ML IJ SOLN: 25.0000 ug | INTRAMUSCULAR | Status: DC | PRN

## 2018-03-17 MED ORDER — PROPOFOL 10 MG/ML IV BOLUS
INTRAVENOUS | Status: DC | PRN
  Administered 2018-03-17: 150 mg via INTRAVENOUS

## 2018-03-17 MED ORDER — FENTANYL CITRATE (PF) 100 MCG/2ML IJ SOLN
INTRAMUSCULAR | Status: DC | PRN
  Administered 2018-03-17: 100 ug via INTRAVENOUS
  Administered 2018-03-17 (×3): 50 ug via INTRAVENOUS

## 2018-03-17 MED ORDER — MIDAZOLAM HCL 5 MG/5ML IJ SOLN
INTRAMUSCULAR | Status: DC | PRN
  Administered 2018-03-17: 2 mg via INTRAVENOUS

## 2018-03-17 MED ORDER — PHENYLEPHRINE 40 MCG/ML (10ML) SYRINGE FOR IV PUSH (FOR BLOOD PRESSURE SUPPORT)
PREFILLED_SYRINGE | INTRAVENOUS | Status: AC
  Filled 2018-03-17: qty 10

## 2018-03-17 MED ORDER — DEXAMETHASONE SODIUM PHOSPHATE 10 MG/ML IJ SOLN
INTRAMUSCULAR | Status: AC
  Filled 2018-03-17: qty 1

## 2018-03-17 MED ORDER — OXYMETAZOLINE HCL 0.05 % NA SOLN
NASAL | Status: DC | PRN
  Administered 2018-03-17: 2 via NASAL

## 2018-03-17 MED ORDER — ROCURONIUM BROMIDE 100 MG/10ML IV SOLN
INTRAVENOUS | Status: DC | PRN
  Administered 2018-03-17: 50 mg via INTRAVENOUS

## 2018-03-17 MED ORDER — EPHEDRINE 5 MG/ML INJ
INTRAVENOUS | Status: AC
  Filled 2018-03-17: qty 10

## 2018-03-17 MED ORDER — SUCCINYLCHOLINE CHLORIDE 200 MG/10ML IV SOSY
PREFILLED_SYRINGE | INTRAVENOUS | Status: AC
  Filled 2018-03-17: qty 10

## 2018-03-17 MED ORDER — LIDOCAINE HCL (CARDIAC) PF 100 MG/5ML IV SOSY
PREFILLED_SYRINGE | INTRAVENOUS | Status: DC | PRN
  Administered 2018-03-17: 40 mg via INTRAVENOUS

## 2018-03-17 MED ORDER — 0.9 % SODIUM CHLORIDE (POUR BTL) OPTIME
TOPICAL | Status: DC | PRN
  Administered 2018-03-17: 1000 mL

## 2018-03-17 MED ORDER — DEXAMETHASONE SODIUM PHOSPHATE 10 MG/ML IJ SOLN
INTRAMUSCULAR | Status: DC | PRN
  Administered 2018-03-17: 10 mg via INTRAVENOUS

## 2018-03-17 MED ORDER — PROPOFOL 10 MG/ML IV BOLUS
INTRAVENOUS | Status: AC
  Filled 2018-03-17: qty 20

## 2018-03-17 SURGICAL SUPPLY — 31 items
BLADE SURG 15 STRL LF DISP TIS (BLADE) ×1 IMPLANT
BLADE SURG 15 STRL SS (BLADE) ×2
BUR CROSS CUT FISSURE 1.6 (BURR) ×2 IMPLANT
BUR EGG ELITE 4.0 (BURR) ×2 IMPLANT
CANISTER SUCT 3000ML PPV (MISCELLANEOUS) ×2 IMPLANT
COVER SURGICAL LIGHT HANDLE (MISCELLANEOUS) ×2 IMPLANT
DECANTER SPIKE VIAL GLASS SM (MISCELLANEOUS) ×2 IMPLANT
GAUZE PACKING FOLDED 2  STR (GAUZE/BANDAGES/DRESSINGS) ×1
GAUZE PACKING FOLDED 2 STR (GAUZE/BANDAGES/DRESSINGS) ×1 IMPLANT
GLOVE BIO SURGEON STRL SZ 6.5 (GLOVE) ×1 IMPLANT
GLOVE BIO SURGEON STRL SZ7 (GLOVE) IMPLANT
GLOVE BIO SURGEON STRL SZ7.5 (GLOVE) ×2 IMPLANT
GLOVE BIOGEL PI IND STRL 6.5 (GLOVE) IMPLANT
GLOVE BIOGEL PI IND STRL 7.0 (GLOVE) IMPLANT
GLOVE BIOGEL PI INDICATOR 6.5 (GLOVE)
GLOVE BIOGEL PI INDICATOR 7.0 (GLOVE)
GOWN STRL REUS W/ TWL LRG LVL3 (GOWN DISPOSABLE) ×1 IMPLANT
GOWN STRL REUS W/ TWL XL LVL3 (GOWN DISPOSABLE) ×1 IMPLANT
GOWN STRL REUS W/TWL LRG LVL3 (GOWN DISPOSABLE) ×2
GOWN STRL REUS W/TWL XL LVL3 (GOWN DISPOSABLE) ×2
KIT BASIN OR (CUSTOM PROCEDURE TRAY) ×2 IMPLANT
KIT TURNOVER KIT B (KITS) ×2 IMPLANT
NEEDLE 22X1 1/2 (OR ONLY) (NEEDLE) ×4 IMPLANT
NS IRRIG 1000ML POUR BTL (IV SOLUTION) ×2 IMPLANT
PAD ARMBOARD 7.5X6 YLW CONV (MISCELLANEOUS) ×2 IMPLANT
SPONGE SURGIFOAM ABS GEL 12-7 (HEMOSTASIS) IMPLANT
SUT CHROMIC 3 0 PS 2 (SUTURE) ×3 IMPLANT
SYR CONTROL 10ML LL (SYRINGE) ×2 IMPLANT
TRAY ENT MC OR (CUSTOM PROCEDURE TRAY) ×2 IMPLANT
TUBING IRRIGATION (MISCELLANEOUS) ×2 IMPLANT
YANKAUER SUCT BULB TIP NO VENT (SUCTIONS) ×2 IMPLANT

## 2018-03-17 NOTE — Anesthesia Postprocedure Evaluation (Signed)
Anesthesia Post Note  Patient: [name]  Procedure(s) Performed: DENTAL EXTRACTIONS TEETH NUMBER TWENTY ONE, TWENTY TWO, TWENTY THREE, TWENTY FOUR, TWENTY FIVE, TWENTY SIX, TWENTY SEVEN, TWENTY EIGHT, ALVEOLOPLASTY (N/A Mouth)     Patient location during evaluation: PACU Anesthesia Type: General Level of consciousness: awake and alert Pain management: pain level controlled Vital Signs Assessment: post-procedure vital signs reviewed and stable Respiratory status: spontaneous breathing, nonlabored ventilation, respiratory function stable and patient connected to nasal cannula oxygen Cardiovascular status: blood pressure returned to baseline and stable Postop Assessment: no apparent nausea or vomiting Anesthetic complications: no    Last Vitals:  Vitals:   03/17/18 0814 03/17/18 0815  BP:    Pulse: 100   Resp: 10   Temp:  (!) 36.3 C  SpO2: 97%     Last Pain:  Vitals:   03/17/18 0552  TempSrc: Oral                 Kathy Schaefer

## 2018-03-17 NOTE — Op Note (Signed)
03/17/2018  8:09 AM  PATIENT:  Iran Sizerebecca L Allbright  57 y.o. female  PRE-OPERATIVE DIAGNOSIS:  NONRESTORABLE TEETH NUMBER TWENTY ONE, TWENTY TWO, TWENTY THREE, TWENTY FOUR, TWENTY FIVE, TWENTY SIX, TWENTY SEVEN, TWENTY EIGHT  POST-OPERATIVE DIAGNOSIS:  SAME  PROCEDURE:  Procedure(s): DENTAL EXTRACTIONS TEETH NUMBER TWENTY ONE, TWENTY TWO, TWENTY THREE, TWENTY FOUR, TWENTY FIVE, TWENTY SIX, TWENTY SEVEN, TWENTY EIGHT, ALVEOLOPLASTY  SURGEON:  Surgeon(s): Ocie DoyneJensen, Ripley Lovecchio, DDS  ANESTHESIA:   local and general  EBL:  minimal  DRAINS: none   SPECIMEN:  No Specimen  COUNTS:  YES  PLAN OF CARE: Discharge to home after PACU  PATIENT DISPOSITION:  PACU - hemodynamically stable.   PROCEDURE DETAILS: Dictation # [id number]  Georgia LopesScott M. Neshia Mckenzie, DMD 03/17/2018 8:09 AM

## 2018-03-17 NOTE — Op Note (Signed)
NAME: Kathy SizerJONES, Kathy L. MEDICAL RECORD [id number] ACCOUNT [id number] DATE OF BIRTH:1960/09/07 FACILITY: [facility] LOCATION: [facility] PHYSICIAN:Diamonte Stavely M. Ashton Sabine, DDS  OPERATIVE REPORT  DATE OF PROCEDURE:  03/17/2018  PREOPERATIVE DIAGNOSIS:  Nonrestorable teeth #21, 22, 23, 24, 25, 26, 27, 28.  POSTOPERATIVE DIAGNOSIS:  Nonrestorable teeth #21, 22, 23, 24, 25, 26, 27, 28.  PROCEDURE: 1. Extraction teeth numbers 21, 22, 23, 24, 25, 26, 27 28. 2. Alveoplasty right and left mandible.  ANESTHESIA:  General,  nasal intubation, Dr. Tacy Duraddono attending.  DESCRIPTION OF PROCEDURE:  The patient was taken to the operating room and placed on the table in supine position.  General anesthesia was administered intravenously and a nasal endotracheal tube was placed and secured.  The eyes were protected and  timeout was performed.  The posterior pharynx was suctioned and a throat pack was placed, 2% lidocaine 1:100,000 epinephrine was infiltrated in the inferior alveolar block on the right and left side and buccal infiltration of the anterior mandible.  A  total of 8 mL was utilized.  A bite block was placed on the right side of the mouth and a sweetheart retractor was used to retract the tongue.  A 15 blade was used to make an incision in the gingival sulcus buccally and lingually around the anterior  mandibular teeth.  The periosteum was reflected.  The teeth were removed with a 301 elevator and  Ash forceps.  The sockets were curetted.  The denture was tried in and then edentulous removed and alveoplasty was performed to remove bony irregularities.   Then, the denture was retried in and seemed to fit appropriately.  The area was irrigated and then closed with 3-0 chromic.  The oral cavity was then irrigated and suctioned and the throat pack was removed.  The patient was left in the care of Anesthesia for extubation and transport to recovery room with plans for discharge home through Day Surgery.  ESTIMATED  BLOOD LOSS:  Minimal.  COMPLICATIONS:  None.  SPECIMENS:  None.     AN/NUANCE  D:03/17/2018 T:03/17/2018 JOB:[id number]

## 2018-03-17 NOTE — Transfer of Care (Signed)
Immediate Anesthesia Transfer of Care Note  Patient: Kathy SizerRebecca L Schaefer  Procedure(s) Performed: DENTAL EXTRACTIONS TEETH NUMBER TWENTY ONE, TWENTY TWO, TWENTY THREE, TWENTY FOUR, TWENTY FIVE, TWENTY SIX, TWENTY SEVEN, TWENTY EIGHT, ALVEOLOPLASTY (N/A Mouth)  Patient Location: PACU  Anesthesia Type:General  Level of Consciousness: awake, alert , oriented and patient cooperative  Airway & Oxygen Therapy: Patient Spontanous Breathing and Patient connected to face mask oxygen  Post-op Assessment: Report given to RN and Post -op Vital signs reviewed and stable  Post vital signs: Reviewed and stable  Last Vitals:  Vitals Value Taken Time  BP 156/89 03/17/2018  8:13 AM  Temp    Pulse 106 03/17/2018  8:13 AM  Resp 17 03/17/2018  8:13 AM  SpO2 98 % 03/17/2018  8:13 AM  Vitals shown include unvalidated device data.  Last Pain:  Vitals:   03/17/18 0552  TempSrc: Oral         Complications: No apparent anesthesia complications

## 2018-03-17 NOTE — H&P (Signed)
H&P documentation  -History and Physical Reviewed  -Patient has been re-examined  -No change in the plan of care  Kathy Schaefer  

## 2018-03-17 NOTE — Anesthesia Procedure Notes (Signed)
Procedure Name: Intubation Date/Time: 03/17/2018 7:45 AM Performed by: Shirlyn Goltz, CRNA Pre-anesthesia Checklist: Patient identified, Emergency Drugs available, Suction available and Patient being monitored Patient Re-evaluated:Patient Re-evaluated prior to induction Oxygen Delivery Method: Circle system utilized Preoxygenation: Pre-oxygenation with 100% oxygen Induction Type: IV induction Ventilation: Mask ventilation without difficulty Laryngoscope Size: Mac and 4 Grade View: Grade I Nasal Tubes: Right, Nasal prep performed and Nasal Rae Tube size: 7.0 mm Number of attempts: 1 Placement Confirmation: ETT inserted through vocal cords under direct vision,  positive ETCO2 and breath sounds checked- equal and bilateral Tube secured with: Tape Dental Injury: Teeth and Oropharynx as per pre-operative assessment

## 2018-03-18 ENCOUNTER — Encounter (HOSPITAL_COMMUNITY): Payer: Self-pay | Admitting: Oral Surgery

## 2018-04-14 ENCOUNTER — Encounter: Payer: Self-pay | Admitting: Internal Medicine

## 2018-04-30 DIAGNOSIS — M533 Sacrococcygeal disorders, not elsewhere classified: Secondary | ICD-10-CM | POA: Insufficient documentation

## 2018-05-02 ENCOUNTER — Ambulatory Visit (AMBULATORY_SURGERY_CENTER): Payer: Self-pay

## 2018-05-02 VITALS — Ht 66.0 in | Wt 248.8 lb

## 2018-05-02 DIAGNOSIS — Z800 Family history of malignant neoplasm of digestive organs: Secondary | ICD-10-CM

## 2018-05-02 NOTE — Progress Notes (Signed)
Per pt, no allergies to soy or egg products.Pt not taking any weight loss meds or using  O2 at home.  Pt refused emmi video. 

## 2018-05-15 ENCOUNTER — Encounter: Payer: Self-pay | Admitting: Internal Medicine

## 2018-08-07 ENCOUNTER — Telehealth: Payer: Self-pay | Admitting: Internal Medicine

## 2018-08-07 ENCOUNTER — Encounter: Payer: Self-pay | Admitting: Internal Medicine

## 2018-08-07 NOTE — Telephone Encounter (Signed)
Pt sched for colon 2/ 21 w Dr. Leone PayorGessner.  Pt has reactive hypoglycemia and would like to know if her blood sugar will be measured before procedure.

## 2018-08-07 NOTE — Telephone Encounter (Signed)
I explained to her that her BS will be monitored before and after the procedure

## 2018-09-15 ENCOUNTER — Ambulatory Visit (AMBULATORY_SURGERY_CENTER): Payer: Self-pay | Admitting: *Deleted

## 2018-09-15 VITALS — Ht 66.0 in | Wt 248.0 lb

## 2018-09-15 DIAGNOSIS — R195 Other fecal abnormalities: Secondary | ICD-10-CM

## 2018-09-15 DIAGNOSIS — Z800 Family history of malignant neoplasm of digestive organs: Secondary | ICD-10-CM

## 2018-09-15 MED ORDER — NA SULFATE-K SULFATE-MG SULF 17.5-3.13-1.6 GM/177ML PO SOLN: ORAL | 0 refills | Status: DC

## 2018-09-15 NOTE — Progress Notes (Signed)
Patient denies any allergies to eggs or soy. Patient denies any problems with anesthesia/sedation. Patient denies any oxygen use at home. Patient denies taking any diet/weight loss medications or blood thinners. Patient does not want to drink Gatorade because she has "reactive hypoglycemia". Offered Suprep and patient request to do that over the miralax  Prep. PATIENT REQUEST WE CHECK HER BLOOD SUGAR LEVELS BEFORE PROCEDURE!!

## 2018-09-21 ENCOUNTER — Emergency Department: Payer: Medicare Other

## 2018-09-21 ENCOUNTER — Encounter: Payer: Self-pay | Admitting: Emergency Medicine

## 2018-09-21 ENCOUNTER — Emergency Department
Admission: EM | Admit: 2018-09-21 | Discharge: 2018-09-21 | Disposition: A | Discharge status: Home / Self Care | Payer: Medicare Other | Attending: Emergency Medicine | Admitting: Emergency Medicine

## 2018-09-21 ENCOUNTER — Other Ambulatory Visit: Payer: Self-pay

## 2018-09-21 DIAGNOSIS — Y990 Civilian activity done for income or pay: Secondary | ICD-10-CM | POA: Insufficient documentation

## 2018-09-21 DIAGNOSIS — Y9389 Activity, other specified: Secondary | ICD-10-CM | POA: Diagnosis not present

## 2018-09-21 DIAGNOSIS — S8391XA Sprain of unspecified site of right knee, initial encounter: Secondary | ICD-10-CM | POA: Insufficient documentation

## 2018-09-21 DIAGNOSIS — S8991XA Unspecified injury of right lower leg, initial encounter: Secondary | ICD-10-CM | POA: Diagnosis present

## 2018-09-21 DIAGNOSIS — Y9289 Other specified places as the place of occurrence of the external cause: Secondary | ICD-10-CM | POA: Insufficient documentation

## 2018-09-21 DIAGNOSIS — X509XXA Other and unspecified overexertion or strenuous movements or postures, initial encounter: Secondary | ICD-10-CM | POA: Insufficient documentation

## 2018-09-21 DIAGNOSIS — F17210 Nicotine dependence, cigarettes, uncomplicated: Secondary | ICD-10-CM | POA: Diagnosis not present

## 2018-09-21 DIAGNOSIS — Z7982 Long term (current) use of aspirin: Secondary | ICD-10-CM | POA: Insufficient documentation

## 2018-09-21 DIAGNOSIS — Z8673 Personal history of transient ischemic attack (TIA), and cerebral infarction without residual deficits: Secondary | ICD-10-CM | POA: Insufficient documentation

## 2018-09-21 DIAGNOSIS — Z79899 Other long term (current) drug therapy: Secondary | ICD-10-CM | POA: Insufficient documentation

## 2018-09-21 MED ORDER — KETOROLAC TROMETHAMINE 10 MG PO TABS: 10.0000 mg | ORAL_TABLET | Freq: Three times a day (TID) | ORAL | 0 refills | Status: AC

## 2018-09-21 MED ORDER — KETOROLAC TROMETHAMINE 30 MG/ML IJ SOLN
30.0000 mg | Freq: Once | INTRAMUSCULAR | Status: AC
  Administered 2018-09-21: 30 mg via INTRAMUSCULAR
  Filled 2018-09-21: qty 1

## 2018-09-21 NOTE — Discharge Instructions (Addendum)
Your knee exam and x-ray are consistent with a knee sprain. There is no evidence of fracture or dislocation. Wear the knee brace as needed. Rest with the leg elevated when seated. Apply ice to reduce pain and swelling.

## 2018-09-21 NOTE — ED Notes (Signed)
ED Provider at bedside. 

## 2018-09-21 NOTE — ED Notes (Signed)
Refer to triage note. Pt states "I have arthritis and some issues" but today "I was at my table doing paperwork and when I turned I felt a shooting pain; something pooped, it made a sound" on R/knee. Pt states she can't bare any wt on R/leg. Pt denies taking medication to relief the pain. Pt is uncomfortable in bed. This RN will continue to monitor.

## 2018-09-21 NOTE — ED Notes (Signed)
Upon assessment, no deformities/sweeling noted on r/knee. NAD noted at this time.

## 2018-09-21 NOTE — ED Provider Notes (Signed)
[facility] Emergency Department Provider Note ____________________________________________  Time seen: 1539  I have reviewed the triage vital signs and the nursing notes.  HISTORY  Chief Complaint  Knee Pain  HPI Kathy Schaefer is a 58 y.o. female presents herself to the ED for evaluation of left knee pain after mechanical injury.  Patient describes she was at work, and she turned on a planted foot, resulting in immediate pain and disability to the right knee.  She describes pain is increased with attempts to weight-bear.  Patient denies any outright fall, but reports pain to the anterior and inferior portion of the knee.  She gives a history of arthritis to the knee and other joints.  Takes daily doses of diclofenac, tizanidine as needed, and medications for her depression and triglycerides.  Past Medical History:  Diagnosis Date  . Abdominal aortic aneurysm (AAA) (HCC)    ectatic abdominal aorta 2016  . Allergy to alpha-gal   . DDD (degenerative disc disease), lumbar    and cervical per pt  . Degenerative disc disease   . Depression    pt denies  . GERD (gastroesophageal reflux disease)   . Head ache   . History of hiatal hernia    years ago  . Hyperlipidemia   . Hypoglycemia    when she doesn"t eat or have protein  . Neuropathy   . Stroke [facility]) 2018   TIA    Patient Active Problem List   Diagnosis Date Noted  . High triglycerides 11/16/2013  . 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 Osteoarthritis 03/27/2013  . Depression (emotion) 03/27/2013  . GERD 06/13/2007  . PLANTAR FASCIITIS 06/13/2007    Past Surgical History:  Procedure Laterality Date  . COLONOSCOPY    . MOUTH SURGERY  03/17/2018   removed 8 bottom teeth  . TOOTH EXTRACTION N/A 03/17/2018   Procedure: DENTAL EXTRACTIONS TEETH NUMBER TWENTY ONE, TWENTY TWO, TWENTY THREE, TWENTY FOUR, TWENTY FIVE, TWENTY SIX, TWENTY SEVEN, TWENTY EIGHT, ALVEOLOPLASTY;  Surgeon: Ocie Doyne,  DDS;  Location: [facility] OR;  Service: Oral Surgery;  Laterality: N/A;    Prior to Admission medications   Medication Sig Start Date End Date Taking? Authorizing Provider  amitriptyline (ELAVIL) 25 MG tablet TAKE ONE TABLET BY MOUTH AT BEDTIME Patient taking differently: Take 25 mg by mouth at bedtime.  11/15/13   Doris Cheadle, MD  ARNICA EX Apply 1 application topically daily as needed (for pain).    [provider]  aspirin EC 81 MG tablet Take 81 mg by mouth daily.    [provider]  bisacodyl (DULCOLAX) 5 MG EC tablet Take 5 mg by mouth. Dulcolax 5 mg bowel prep #4-Take as directed    [provider]  Coenzyme Q10 (CO Q-10) 100 MG CAPS Take 100 mg by mouth daily.    [provider]  diclofenac (VOLTAREN) 75 MG EC tablet Take 1 tablet (75 mg total) by mouth 2 (two) times daily. 11/15/13   Doris Cheadle, MD  gabapentin (NEURONTIN) 300 MG capsule Take 1 capsule (300 mg total) by mouth 3 (three) times daily. Patient taking differently: Take 300 mg by mouth at bedtime.  12/17/14 09/15/18  Beers, Charmayne Sheer, PA-C  GLUCOSAMINE-CHONDROITIN PO Take 1 tablet by mouth daily.    [provider]  Lecithin 1200 MG CAPS Take 1,200 mg by mouth daily.    [provider]  Na Sulfate-K Sulfate-Mg Sulf 17.5-3.13-1.6 GM/177ML SOLN Suprep (no substitutions)-TAKE AS DIRECTED. 09/15/18   Leone Payor,  Maryjean Mornarl E, MD  omeprazole (PRILOSEC) 20 MG capsule Take 20 mg by mouth daily.    [provider]  Polyethylene Glycol 3350 (MIRALAX PO) Take by mouth. Miralax 238 gm bowel prep-Take as directed    [provider]  tiZANidine (ZANAFLEX) 4 MG tablet TAKE 1 TABLET BY MOUTH THREE TIMES DAILY 05/26/18   [provider]  traMADol (ULTRAM) 50 MG tablet Take by mouth as needed.    [provider]  trolamine salicylate (ASPERCREME) 10 % cream Apply 1 application topically daily as needed for muscle pain.    [provider]     Allergies Alpha-gal; Beef-derived products; Gelatin (bovine) [beef extract]; Lambs quarters; Phenylpropanolamine hcl; Pork-derived products; Guaifenesin; and Cyclobenzaprine hcl  Family History  Problem Relation Age of Onset  . Emphysema Mother   . Diabetes Mother   . Heart disease Mother   . Arthritis Mother   . Pancreatic cancer Father   . Colon cancer Father   . ALS Sister   . Hyperlipidemia Brother   . Esophageal cancer Neg Hx   . Rectal cancer Neg Hx   . Stomach cancer Neg Hx   . Colon polyps Neg Hx     Social History Social History   Tobacco Use  . Smoking status: Current Every Day Smoker    Packs/day: 0.25    Types: Cigarettes  . Smokeless tobacco: Never Used  Substance Use Topics  . Alcohol use: Not Currently  . Drug use: No    Review of Systems  Constitutional: Negative for fever. Cardiovascular: Negative for chest pain. Respiratory: Negative for shortness of breath. Musculoskeletal: Negative for back pain.  Right knee pain as above. Skin: Negative for rash. Neurological: Negative for headaches, focal weakness or numbness. ____________________________________________  PHYSICAL EXAM:  VITAL SIGNS: ED Triage Vitals  Enc Vitals Group     BP 09/21/18 1429 (!) 143/106     Pulse Rate 09/21/18 1429 81     Resp 09/21/18 1429 16     Temp 09/21/18 1429 98.1 F (36.7 C)     Temp Source 09/21/18 1429 Oral     SpO2 09/21/18 1429 100 %     Weight 09/21/18 1431 248 lb 0.3 oz (112.5 kg)     Height 09/21/18 1431 5\' 6"  (1.676 m)     Head Circumference --      Peak Flow --      Pain Score 09/21/18 1430 8     Pain Loc --      Pain Edu? --      Excl. in GC? --     Constitutional: Alert and oriented. Well appearing and in no distress. Head: Normocephalic and atraumatic. Eyes: Conjunctivae are normal. Normal extraocular movements Cardiovascular: Normal rate, regular rhythm. Normal distal pulses. Respiratory: Normal respiratory effort. No  wheezes/rales/rhonchi. Musculoskeletal: Right knee without obvious deformity, dislocation, or joint effusion.  Patient with tenderness palpation to the inferior patella at the tendon insertion.  No significant valgus or varus strain stress is elicited.  No tenderness to the medial lateral joint lines are appreciated.  Patient is without any popliteal space fullness, or any calf/Achilles tenderness.  She is able to range the knee slowly and passively without disability or crepitus.  Nontender with normal range of motion in all extremities.  Neurologic:  Normal speech and language. No gross focal neurologic deficits are appreciated. Skin:  Skin is warm, dry and intact. No rash noted. ____________________________________________   RADIOLOGY  Right Knee  IMPRESSION: Mild degenerative joint  disease is noted medially. No acute abnormality seen in the right knee. ____________________________________________  PROCEDURES  Procedures Knee immobilizer Toradol 30 mg IM Standard walker ____________________________________________  INITIAL IMPRESSION / ASSESSMENT AND PLAN / ED COURSE  Patient with ED evaluation of sudden right knee pain after mechanical injury.  Patient scribes turning on a planted foot and experiencing immediate pain and disability.  Her exam is concerning for knee sprain.  Her x-ray is reassuring as it shows no acute fracture or dislocation.  Patient also has relatively well-maintained medial and lateral meniscus.  She is placed in Ace bandage and knee immobilizer for support.  She is referred to orthopedics for further evaluation.  A prescription for Toradol is provided to take in lieu of her daily diclofenac.  She will follow-up or return to the ED as needed. ____________________________________________  FINAL CLINICAL IMPRESSION(S) / ED DIAGNOSES  Final diagnoses:  Sprain of right knee, unspecified ligament, initial encounter      Lissa HoardMenshew, Jerrico Covello V Bacon, PA-C 09/21/18  Gracy [id number]    Goodman, Graydon, MD 09/21/18 1910

## 2018-09-21 NOTE — ED Triage Notes (Signed)
Says she turned today and she injured her right knee.  Says pain is worse with wt bearing.  Says she cant walk.  Says shooting painls

## 2018-09-29 ENCOUNTER — Encounter: Payer: Self-pay | Admitting: Internal Medicine

## 2018-10-18 ENCOUNTER — Encounter: Payer: Self-pay | Admitting: *Deleted

## 2018-10-20 ENCOUNTER — Ambulatory Visit (AMBULATORY_SURGERY_CENTER): Payer: Medicare Other | Admitting: Internal Medicine

## 2018-10-20 ENCOUNTER — Other Ambulatory Visit: Payer: Self-pay

## 2018-10-20 ENCOUNTER — Encounter: Payer: Self-pay | Admitting: Internal Medicine

## 2018-10-20 VITALS — BP 131/78 | HR 61 | Temp 98.9°F | Resp 9 | Ht 66.0 in | Wt 248.0 lb

## 2018-10-20 DIAGNOSIS — K5730 Diverticulosis of large intestine without perforation or abscess without bleeding: Secondary | ICD-10-CM

## 2018-10-20 DIAGNOSIS — K648 Other hemorrhoids: Secondary | ICD-10-CM | POA: Diagnosis not present

## 2018-10-20 DIAGNOSIS — R195 Other fecal abnormalities: Secondary | ICD-10-CM

## 2018-10-20 DIAGNOSIS — Z800 Family history of malignant neoplasm of digestive organs: Secondary | ICD-10-CM

## 2018-10-20 MED ORDER — SODIUM CHLORIDE 0.9 % IV SOLN: 500.0000 mL | Freq: Once | INTRAVENOUS | Status: DC

## 2018-10-20 NOTE — Op Note (Signed)
[facility] Patient Name: Kathy Schaefer Procedure Date: 10/20/2018 10:54 AM MRN: [id number] Endoscopist: Iva Boop , MD Age: 58 Referring MD:  Date of Birth: 1961/07/06 Gender: Female Account #: [id number] Procedure:                Colonoscopy Indications:              Positive fecal immunochemical test Medicines:                Propofol per Anesthesia, Monitored Anesthesia Care Procedure:                Pre-Anesthesia Assessment:                           - Prior to the procedure, a History and Physical                            was performed, and patient medications and                            allergies were reviewed. The patient's tolerance of                            previous anesthesia was also reviewed. The risks                            and benefits of the procedure and the sedation                            options and risks were discussed with the patient.                            All questions were answered, and informed consent                            was obtained. Prior Anticoagulants: The patient has                            taken no previous anticoagulant or antiplatelet                            agents. ASA Grade Assessment: III - A patient with                            severe systemic disease. After reviewing the risks                            and benefits, the patient was deemed in                            satisfactory condition to undergo the procedure.                           After obtaining informed consent, the colonoscope  was passed under direct vision. Throughout the                            procedure, the patient's blood pressure, pulse, and                            oxygen saturations were monitored continuously. The                            Colonoscope was introduced through the anus and                            advanced to the the cecum, identified by   appendiceal orifice and ileocecal valve. The                            colonoscopy was performed without difficulty. The                            patient tolerated the procedure well. The quality                            of the bowel preparation was excellent. The bowel                            preparation used was Miralax. The ileocecal valve,                            appendiceal orifice, and rectum were photographed. Scope In: 11:04:06 AM Scope Out: 11:17:52 AM Scope Withdrawal Time: 0 hours 9 minutes 20 seconds  Total Procedure Duration: 0 hours 13 minutes 46 seconds  Findings:                 The perianal and digital rectal examinations were                            normal.                           Multiple diverticula were found in the sigmoid                            colon.                           External and internal hemorrhoids were found.                           The exam was otherwise without abnormality on                            direct and retroflexion views. Complications:            No immediate complications. Estimated Blood Loss:     Estimated blood loss: none. Impression:               - Diverticulosis in  the sigmoid colon.                           - External and internal hemorrhoids.                           - The examination was otherwise normal on direct                            and retroflexion views.                           - No specimens collected. Recommendation:           - Patient has a contact number available for                            emergencies. The signs and symptoms of potential                            delayed complications were discussed with the                            patient. Return to normal activities tomorrow.                            Written discharge instructions were provided to the                            patient.                           - Resume previous diet.                           - Continue  present medications.                           - Repeat colonoscopy in 5 years for screening                            purposes. Her father had colon cancer dx in 31's Iva Boop, MD 10/20/2018 11:23:25 AM This report has been signed electronically.

## 2018-10-20 NOTE — Patient Instructions (Addendum)
   No polyps or cancer. I saw inflamed hemorrhoids (likely cause of blood in stool) and diverticulosis.  Since your dad had colon cancer it is expert opinion that you have a colonoscopy every 5 years.  Your next routine colonoscopy should be in 5 years - 2025.  I appreciate the opportunity to care for you. Iva Boop, MD, FACG  YOU HAD AN ENDOSCOPIC PROCEDURE TODAY AT THE [facility]:   Refer to the procedure report that was given to you for any specific questions about what was found during the examination.  If the procedure report does not answer your questions, please call your gastroenterologist to clarify.  If you requested that your care partner not be given the details of your procedure findings, then the procedure report has been included in a sealed envelope for you to review at your convenience later.  YOU SHOULD EXPECT: Some feelings of bloating in the abdomen. Passage of more gas than usual.  Walking can help get rid of the air that was put into your GI tract during the procedure and reduce the bloating. If you had a lower endoscopy (such as a colonoscopy or flexible sigmoidoscopy) you may notice spotting of blood in your stool or on the toilet paper. If you underwent a bowel prep for your procedure, you may not have a normal bowel movement for a few days.  Please Note:  You might notice some irritation and congestion in your nose or some drainage.  This is from the oxygen used during your procedure.  There is no need for concern and it should clear up in a day or so.  SYMPTOMS TO REPORT IMMEDIATELY:   Following lower endoscopy (colonoscopy or flexible sigmoidoscopy):  Excessive amounts of blood in the stool  Significant tenderness or worsening of abdominal pains  Swelling of the abdomen that is new, acute  Fever of 100F or higher  For urgent or emergent issues, a gastroenterologist can be reached at any hour by calling [phone].  DIET:  We do  recommend a small meal at first, but then you may proceed to your regular diet.  Drink plenty of fluids but you should avoid alcoholic beverages for 24 hours.  ACTIVITY:  You should plan to take it easy for the rest of today and you should NOT DRIVE or use heavy machinery until tomorrow (because of the sedation medicines used during the test).    FOLLOW UP: Our staff will call the number listed on your records the next business day following your procedure to check on you and address any questions or concerns that you may have regarding the information given to you following your procedure. If we do not reach you, we will leave a message.  However, if you are feeling well and you are not experiencing any problems, there is no need to return our call.  We will assume that you have returned to your regular daily activities without incident.  SIGNATURES/CONFIDENTIALITY: You and/or your care partner have signed paperwork which will be entered into your electronic medical record.  These signatures attest to the fact that that the information above on your After Visit Summary has been reviewed and is understood.  Full responsibility of the confidentiality of this discharge information lies with you and/or your care-partner  Please read over handouts about diverticulosis and hemorrhoids  Continue your normal medications  Next colonoscopy- 5 years.

## 2018-10-20 NOTE — Progress Notes (Signed)
Pt awake. VSS. Report given to RN. No anesthetic complications noted 

## 2018-10-23 ENCOUNTER — Telehealth: Payer: Self-pay

## 2018-10-23 ENCOUNTER — Telehealth: Payer: Self-pay | Admitting: *Deleted

## 2018-10-23 NOTE — Telephone Encounter (Signed)
Called [phone] and left a messaged we tried to reach pt for a follow up call. maw

## 2018-10-23 NOTE — Telephone Encounter (Signed)
  Follow up Call-  Call back number 10/20/2018  Post procedure Call Back phone  # [phone]  Permission to leave phone message Yes  Some recent data might be hidden     Patient questions:  Message left to call us if necessary.  Second call.

## 2018-12-01 DIAGNOSIS — M25561 Pain in right knee: Secondary | ICD-10-CM | POA: Insufficient documentation

## 2018-12-01 DIAGNOSIS — G8929 Other chronic pain: Secondary | ICD-10-CM | POA: Insufficient documentation

## 2018-12-01 DIAGNOSIS — K5790 Diverticulosis of intestine, part unspecified, without perforation or abscess without bleeding: Secondary | ICD-10-CM | POA: Insufficient documentation

## 2019-01-17 ENCOUNTER — Other Ambulatory Visit: Payer: Self-pay | Admitting: *Deleted

## 2019-01-17 DIAGNOSIS — Z20822 Contact with and (suspected) exposure to covid-19: Secondary | ICD-10-CM

## 2019-01-19 LAB — NOVEL CORONAVIRUS, NAA: SARS-CoV-2, NAA: NOT DETECTED

## 2019-06-04 DIAGNOSIS — R7303 Prediabetes: Secondary | ICD-10-CM | POA: Insufficient documentation

## 2019-12-16 ENCOUNTER — Encounter (HOSPITAL_COMMUNITY): Payer: Self-pay | Admitting: Emergency Medicine

## 2019-12-16 ENCOUNTER — Emergency Department (HOSPITAL_COMMUNITY): Payer: Medicare Other

## 2019-12-16 ENCOUNTER — Other Ambulatory Visit: Payer: Self-pay

## 2019-12-16 ENCOUNTER — Emergency Department (HOSPITAL_COMMUNITY)
Admission: EM | Admit: 2019-12-16 | Discharge: 2019-12-16 | Disposition: A | Discharge status: Home / Self Care | Payer: Medicare Other | Attending: Emergency Medicine | Admitting: Emergency Medicine

## 2019-12-16 DIAGNOSIS — F17210 Nicotine dependence, cigarettes, uncomplicated: Secondary | ICD-10-CM | POA: Insufficient documentation

## 2019-12-16 DIAGNOSIS — R519 Headache, unspecified: Secondary | ICD-10-CM | POA: Insufficient documentation

## 2019-12-16 DIAGNOSIS — I10 Essential (primary) hypertension: Secondary | ICD-10-CM | POA: Insufficient documentation

## 2019-12-16 LAB — BASIC METABOLIC PANEL
Anion gap: 11 (ref 5–15)
BUN: 15 mg/dL (ref 6–20)
CO2: 25 mmol/L (ref 22–32)
Calcium: 9.3 mg/dL (ref 8.9–10.3)
Chloride: 104 mmol/L (ref 98–111)
Creatinine, Ser: 0.84 mg/dL (ref 0.44–1.00)
GFR calc Af Amer: >60 mL/min (ref >60)
GFR calc non Af Amer: >60 mL/min (ref >60)
Glucose, Bld: 158 mg/dL — ABNORMAL HIGH (ref 70–99)
Potassium: 3.8 mmol/L (ref 3.5–5.1)
Sodium: 140 mmol/L (ref 135–145)

## 2019-12-16 LAB — URINALYSIS, ROUTINE W REFLEX MICROSCOPIC
Bacteria, UA: NONE SEEN
Bilirubin Urine: NEGATIVE
Glucose, UA: NEGATIVE mg/dL
Ketones, ur: NEGATIVE mg/dL
Leukocytes,Ua: NEGATIVE
Nitrite: NEGATIVE
Protein, ur: NEGATIVE mg/dL
Specific Gravity, Urine: 1.014 (ref 1.005–1.030)
pH: 5 (ref 5.0–8.0)

## 2019-12-16 LAB — CBC
HCT: 44.2 % (ref 36.0–46.0)
Hemoglobin: 14.4 g/dL (ref 12.0–15.0)
MCH: 30.6 pg (ref 26.0–34.0)
MCHC: 32.6 g/dL (ref 30.0–36.0)
MCV: 93.8 fL (ref 80.0–100.0)
Platelets: 346 10*3/uL (ref 150–400)
RBC: 4.71 MIL/uL (ref 3.87–5.11)
RDW: 11.9 % (ref 11.5–15.5)
WBC: 8.6 10*3/uL (ref 4.0–10.5)
nRBC: 0 % (ref 0.0–0.2)

## 2019-12-16 LAB — TROPONIN I (HIGH SENSITIVITY): Troponin I (High Sensitivity): 3 ng/L (ref <18)

## 2019-12-16 MED ORDER — SODIUM CHLORIDE 0.9% FLUSH: 3.0000 mL | Freq: Once | INTRAVENOUS | Status: DC

## 2019-12-16 MED ORDER — ACETAMINOPHEN 500 MG PO TABS
1000.0000 mg | ORAL_TABLET | Freq: Once | ORAL | Status: AC
  Administered 2019-12-16: 1000 mg via ORAL
  Filled 2019-12-16: qty 2

## 2019-12-16 NOTE — Discharge Instructions (Addendum)
You were evaluated in the Emergency Department and after careful evaluation, we did not find any emergent condition requiring admission or further testing in the hospital.  Your exam/testing today was overall reassuring.  We encourage you to follow-up with your primary care doctor to discuss your high blood pressure management.  Please return to the Emergency Department if you experience any worsening of your condition.  We encourage you to follow up with a primary care provider.  Thank you for allowing [place] to be a part of your care.

## 2019-12-16 NOTE — ED Notes (Signed)
Per Dr. Lorin Picket does not need Troponin due @ 19:09,.

## 2019-12-16 NOTE — ED Provider Notes (Signed)
[facility]EMER[facility]ENCY DEPT [facility] Emergency Department Provider Note MRN:  [id number]  Arrival date & time: 12/16/19     Chief Complaint   Headache and Hypertension   History of Present Illness   Kathy Schaefer is a 59 y.o. year-old female with a history of stroke, AAA presenting to the ED with chief complaint of headache and hypertension.  Headache that began at 11 or 12 PM today, and she noticed that her blood pressure was high at that time as well.  Had a similar headache with high blood pressure 3 days before.  Denies numbness or weakness, endorses strange sensations and tinglings to the right side of her chest as well as diffusely throughout the body.  Denies shortness of breath, no leg pain or swelling, no dysuria, no abdominal pain.  Symptoms constant, mild to moderate, no exacerbating or alleviating factors.  Review of Systems  A complete 10 system review of systems was obtained and all systems are negative except as noted in the HPI and PMH.   Patient's Health History    Past Medical History:  Diagnosis Date  . Abdominal aortic aneurysm (AAA) (HCC)    ectatic abdominal aorta 2016  . Allergy to alpha-gal   . DDD (degenerative disc disease), lumbar    and cervical per pt  . Degenerative disc disease   . Depression    pt denies  . GERD (gastroesophageal reflux disease)   . Head ache   . History of hiatal hernia    years ago  . Hyperlipidemia   . Hypoglycemia    when she doesn"t eat or have protein  . Neuropathy   . Stroke [facility]) 2018   TIA    Past Surgical History:  Procedure Laterality Date  . COLONOSCOPY    . MOUTH SURGERY  03/17/2018   removed 8 bottom teeth  . TOOTH EXTRACTION N/A 03/17/2018   Procedure: DENTAL EXTRACTIONS TEETH NUMBER TWENTY ONE, TWENTY TWO, TWENTY THREE, TWENTY FOUR, TWENTY FIVE, TWENTY SIX, TWENTY SEVEN, TWENTY EIGHT, ALVEOLOPLASTY;  Surgeon: Ocie Doyne, DDS;  Location: [facility] OR;  Service: Oral Surgery;  Laterality: N/A;    Family  History  Problem Relation Age of Onset  . Emphysema Mother   . Diabetes Mother   . Heart disease Mother   . Arthritis Mother   . Pancreatic cancer Father   . Colon cancer Father   . ALS Sister   . Hyperlipidemia Brother   . Esophageal cancer Neg Hx   . Rectal cancer Neg Hx   . Stomach cancer Neg Hx   . Colon polyps Neg Hx     Social History   Socioeconomic History  . Marital status: Married    Spouse name: Not on file  . Number of children: Not on file  . Years of education: Not on file  . Highest education level: Not on file  Occupational History  . Not on file  Tobacco Use  . Smoking status: Current Every Day Smoker    Packs/day: 0.25    Types: Cigarettes  . Smokeless tobacco: Never Used  Substance and Sexual Activity  . Alcohol use: Not Currently  . Drug use: No  . Sexual activity: Yes  Other Topics Concern  . Not on file  Social History Narrative  . Not on file   Social Determinants of Health   Financial Resource Strain:   . Difficulty of Paying Living Expenses:   Food Insecurity:   . Worried About Programme researcher, broadcasting/film/video in the Last  Year:   . Ran Out of Food in the Last Year:   Transportation Needs:   . Film/video editor (Medical):   Marland Kitchen Lack of Transportation (Non-Medical):   Physical Activity:   . Days of Exercise per Week:   . Minutes of Exercise per Session:   Stress:   . Feeling of Stress :   Social Connections:   . Frequency of Communication with Friends and Family:   . Frequency of Social Gatherings with Friends and Family:   . Attends Religious Services:   . Active Member of Clubs or Organizations:   . Attends Archivist Meetings:   Marland Kitchen Marital Status:   Intimate Partner Violence:   . Fear of Current or Ex-Partner:   . Emotionally Abused:   Marland Kitchen Physically Abused:   . Sexually Abused:      Physical Exam   Vitals:   12/16/19 1620 12/16/19 1715  BP: (!) 157/100 (!) 168/87  Pulse: 89 78  Resp: 18   Temp: 99.1 F (37.3 C)     SpO2: 100% 100%    CONSTITUTIONAL: Well-appearing, NAD NEURO:  Alert and oriented x 3, no focal deficits EYES:  eyes equal and reactive ENT/NECK:  no LAD, no JVD CARDIO: Regular rate, well-perfused, normal S1 and S2 PULM:  CTAB no wheezing or rhonchi GI/GU:  normal bowel sounds, non-distended, non-tender MSK/SPINE:  No gross deformities, no edema SKIN:  no rash, atraumatic PSYCH:  Appropriate speech and behavior  *Additional and/or pertinent findings included in MDM below  Diagnostic and Interventional Summary    EKG Interpretation  Date/Time:  Sunday Dec 16 2019 17:50:35 EDT Ventricular Rate:  75 PR Interval:    QRS Duration: 82 QT Interval:  379 QTC Calculation: 424 R Axis:   32 Text Interpretation: Sinus rhythm Confirmed by Gerlene Fee [phone]) on 12/16/2019 7:04:38 PM      Labs Reviewed  BASIC METABOLIC PANEL - Abnormal; Notable for the following components:      Result Value   Glucose, Bld 158 (*)    All other components within normal limits  URINALYSIS, ROUTINE W REFLEX MICROSCOPIC - Abnormal; Notable for the following components:   Hgb urine dipstick SMALL (*)    All other components within normal limits  CBC  TROPONIN I (HIGH SENSITIVITY)  TROPONIN I (HIGH SENSITIVITY)  TROPONIN I (HIGH SENSITIVITY)    CT Head Wo Contrast  Final Result    DG Chest Port 1 View  Final Result      Medications  sodium chloride flush (NS) 0.9 % injection 3 mL (has no administration in time range)  acetaminophen (TYLENOL) tablet 1,000 mg (1,000 mg Oral Given 12/16/19 1754)     Procedures  /  Critical Care Procedures  ED Course and Medical Decision Making  I have reviewed the triage vital signs, the nursing notes, and pertinent available records from the EMR.  Listed above are laboratory and imaging tests that I personally ordered, reviewed, and interpreted and then considered in my medical decision making (see below for details).      Headache with hypertension,  suspect they are largely unrelated as the headache seems more consistent with a tension type headache.  Still given patient's history of stroke and no history of hypertension, will obtain CT head to exclude intracranial bleeding.  Also with atypical chest pain, will evaluate with labs to exclude signs of endorgan dysfunction.  Neurologically patient is doing well with no focal deficits.  Work-up is very reassuring, no signs of  endorgan dysfunction, patient is feeling better after Tylenol.  Patient is appropriate for PCP follow-up for further blood pressure management.  Elmer Sow. Pilar Plate, MD [facility] Emergency Medicine [facility] [email or web address]  Final Clinical Impressions(s) / ED Diagnoses     ICD-10-CM   1. Hypertension, unspecified type  I10     ED Discharge Orders    None       Discharge Instructions Discussed with and Provided to Patient:     Discharge Instructions     You were evaluated in the Emergency Department and after careful evaluation, we did not find any emergent condition requiring admission or further testing in the hospital.  Your exam/testing today was overall reassuring.  We encourage you to follow-up with your primary care doctor to discuss your high blood pressure management.  Please return to the Emergency Department if you experience any worsening of your condition.  We encourage you to follow up with a primary care provider.  Thank you for allowing [place] to be a part of your care.        Sabas Sous, MD 12/16/19 [phone]

## 2019-12-16 NOTE — ED Triage Notes (Signed)
Pt arrives to ED from home with complaints of lightheadedness, high blood pressure readings, feeling tingly, and headache starting this morning. Patient states she just feels off. Patient states she had a steroid injection on Wednesday that she had the same symptoms after but felt normal the rest of this week.

## 2020-02-14 ENCOUNTER — Other Ambulatory Visit: Payer: Self-pay | Admitting: Physician Assistant

## 2020-02-14 DIAGNOSIS — I10 Essential (primary) hypertension: Secondary | ICD-10-CM

## 2020-03-21 ENCOUNTER — Ambulatory Visit: Payer: Medicare Other | Admitting: Gastroenterology

## 2020-03-31 DIAGNOSIS — K3184 Gastroparesis: Secondary | ICD-10-CM | POA: Insufficient documentation

## 2020-04-29 ENCOUNTER — Other Ambulatory Visit: Payer: Medicare Other

## 2020-05-07 ENCOUNTER — Ambulatory Visit
Admission: RE | Admit: 2020-05-07 | Discharge: 2020-05-07 | Disposition: A | Discharge status: Home / Self Care | Payer: Medicare Other | Source: Ambulatory Visit | Attending: Physician Assistant | Admitting: Physician Assistant

## 2020-05-07 DIAGNOSIS — I10 Essential (primary) hypertension: Secondary | ICD-10-CM

## 2020-05-20 ENCOUNTER — Ambulatory Visit: Payer: Medicare Other | Admitting: Gastroenterology

## 2020-05-21 DIAGNOSIS — K581 Irritable bowel syndrome with constipation: Secondary | ICD-10-CM | POA: Insufficient documentation

## 2020-08-29 ENCOUNTER — Ambulatory Visit: Payer: Medicare Other | Admitting: Allergy

## 2020-09-06 IMAGING — CT CT HEAD W/O CM
3 series · 15 of 47 positions shown, 18 images · non-contrast
Comparison: Noncontrast head CT 06/10/2017, MRI/MRA head 03/11/2005

CLINICAL DATA: Headache, hypertensive, rule out TIGER.

EXAM:
CT HEAD WITHOUT CONTRAST
TECHNIQUE: Contiguous axial images were obtained from the base of the skull
through the vertex without intravenous contrast.

[Series 3: head 5.0 h30s · axial · 0.48mm/px · z∈[+1264,+1404]mm · 9 of 34 slices shown, 12 images]
[im 3/34  brain]
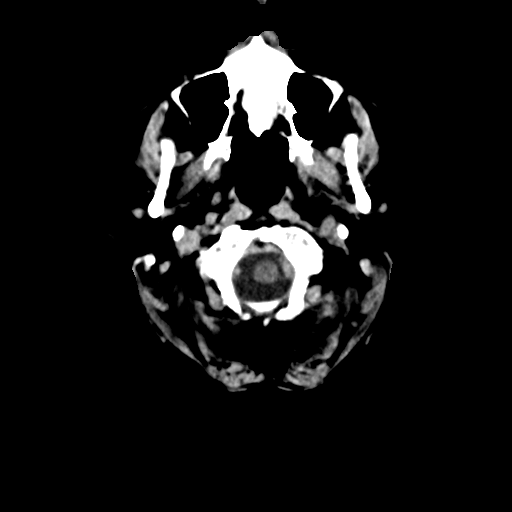
[im 3/34  bone]
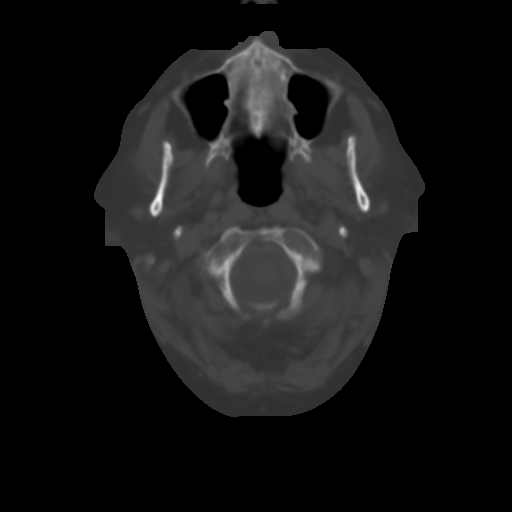
[im 6/34  brain]
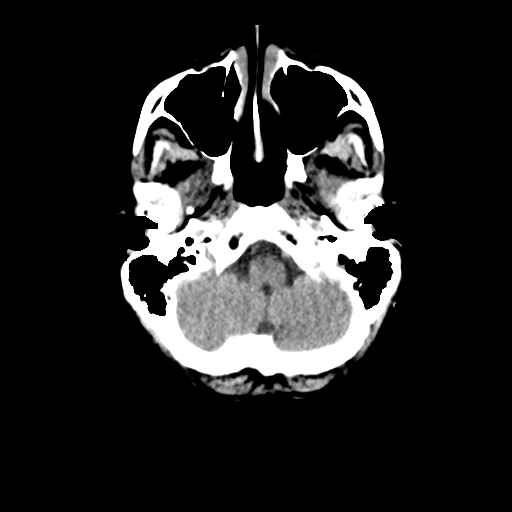
[im 10/34  brain]
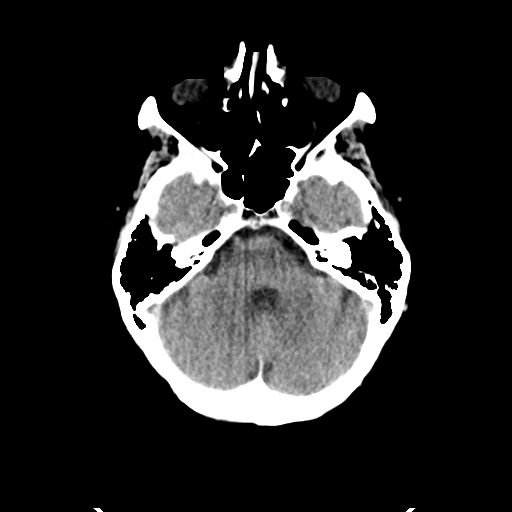
[im 13/34  brain]
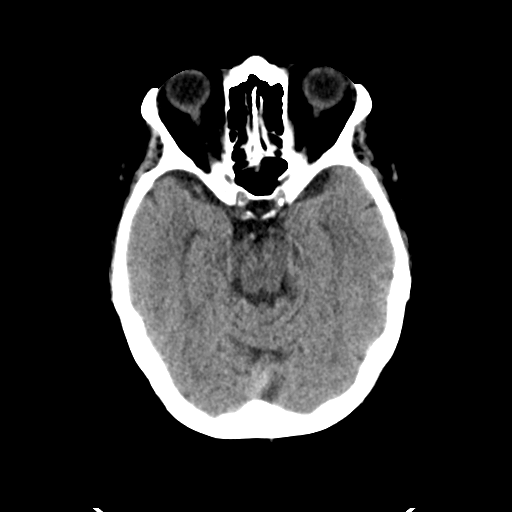
[im 18/34  brain]
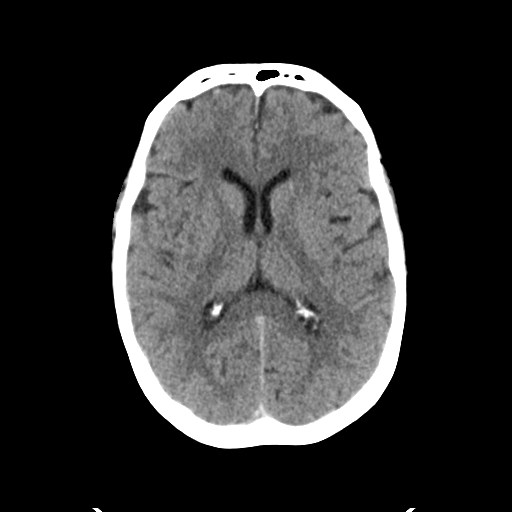
[im 18/34  bone]
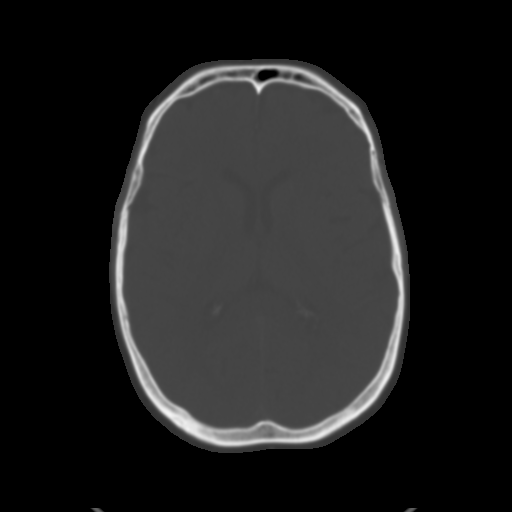
[im 21/34  brain]
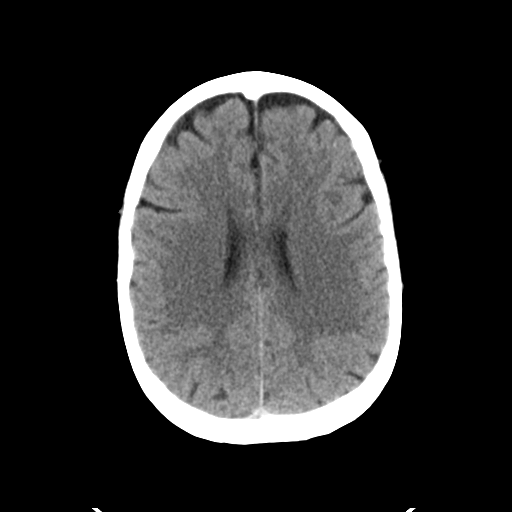
[im 24/34  brain]
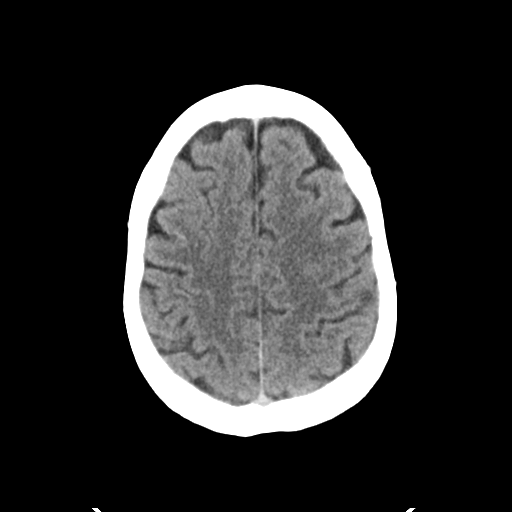
[im 28/34  brain]
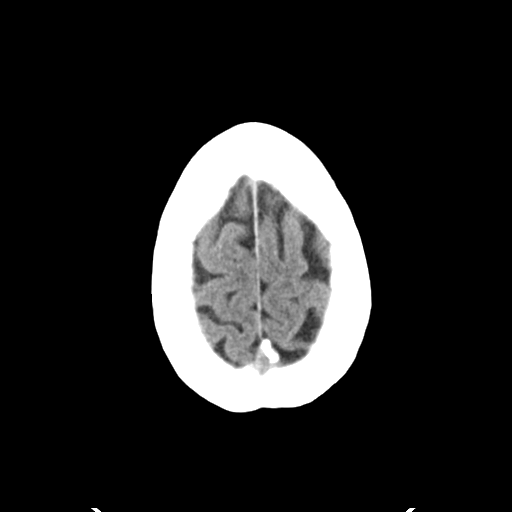
[im 31/34  brain]
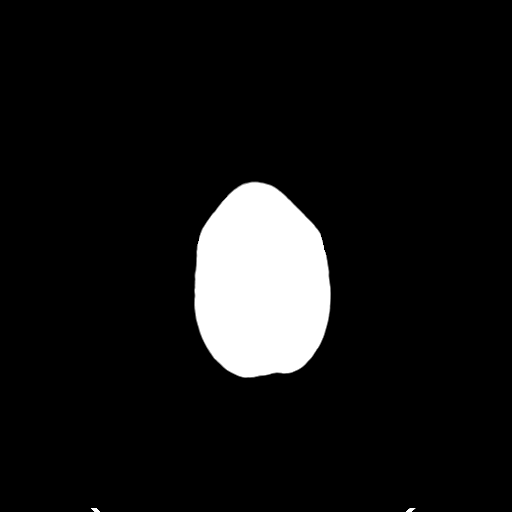
[im 31/34  bone]
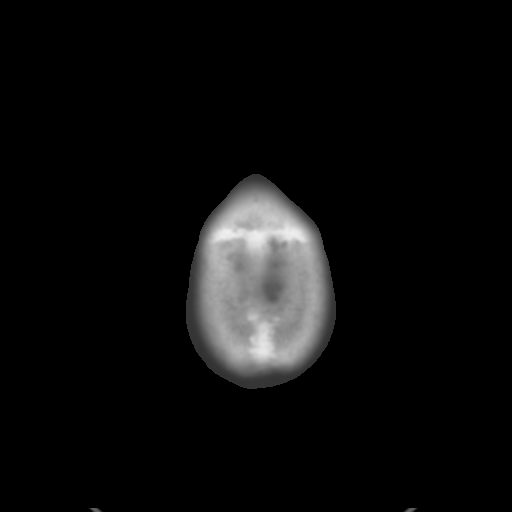

[Series 5: head 3.0 mpr cor · coronal · 0.33mm/px · 3 of 73 slices shown]
[im 25/73  brain]
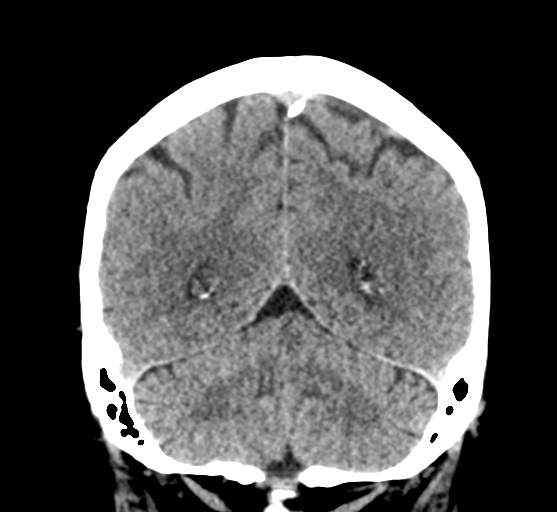
[im 33/73  brain]
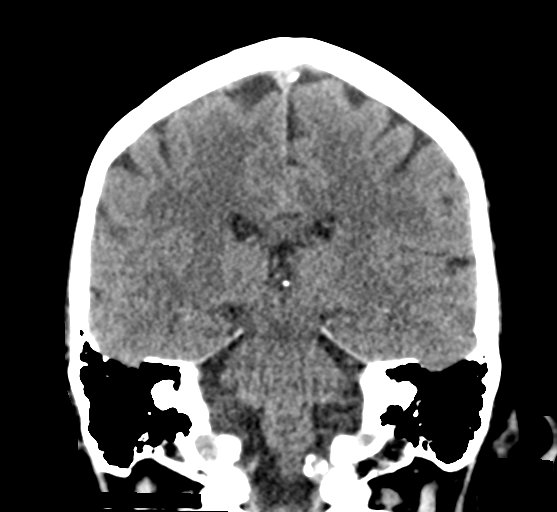
[im 41/73  brain]
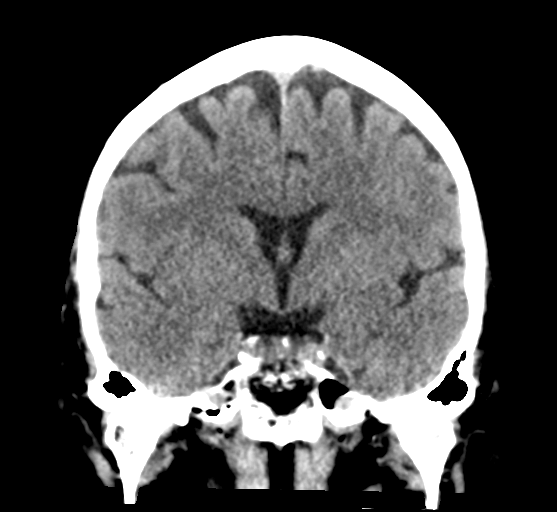

[Series 6: head 3.0 mpr sag · sagittal · 0.33mm/px · 3 of 61 slices shown]
[im 21/61  brain]
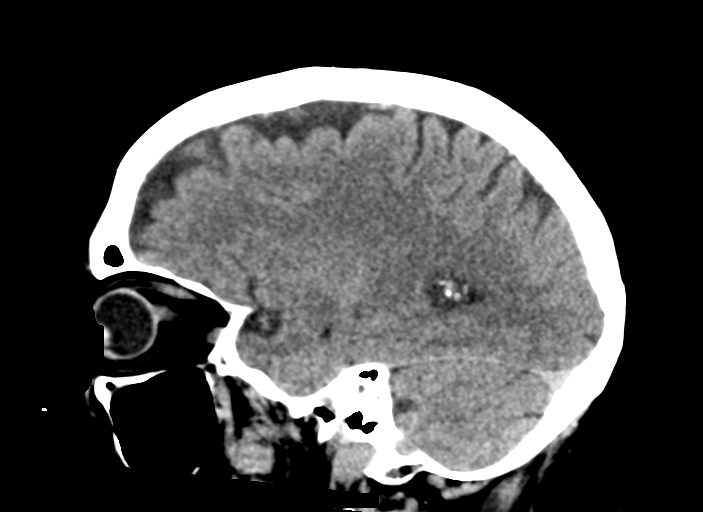
[im 31/61  brain]
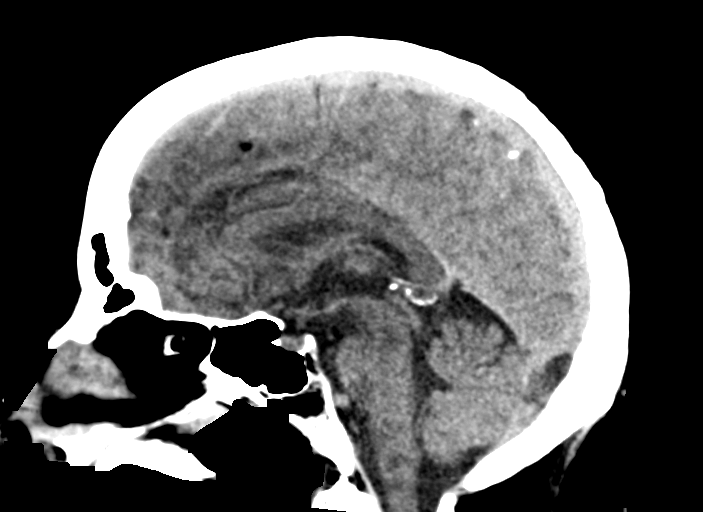
[im 41/61  brain]
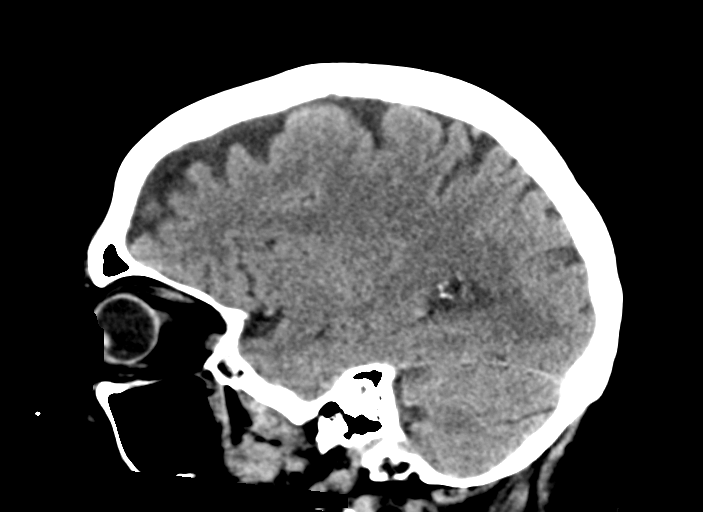

[15 of 47 positions shown; findings below may reference images not displayed]

FINDINGS: Brain:

There is no acute intracranial hemorrhage.

No demarcated cortical infarct.

No extra-axial fluid collection.

No evidence of intracranial mass.

No midline shift.

Vascular: No hyperdense vessel. Atherosclerotic calcifications.

Skull: Normal. Negative for fracture or focal lesion.

Sinuses/Orbits: Visualized orbits show no acute finding. Minimal
ethmoid sinus mucosal thickening. No significant mastoid effusion.
IMPRESSION: No evidence of acute intracranial abnormality.

Minimal ethmoid sinus mucosal thickening.

## 2020-09-06 IMAGING — DX DG CHEST 1V PORT
1 series · 1 of 1 positions shown · non-contrast
Comparison: June 06, 2017

CLINICAL DATA: Lightheadedness.

EXAM:
PORTABLE CHEST 1 VIEW

[chest]
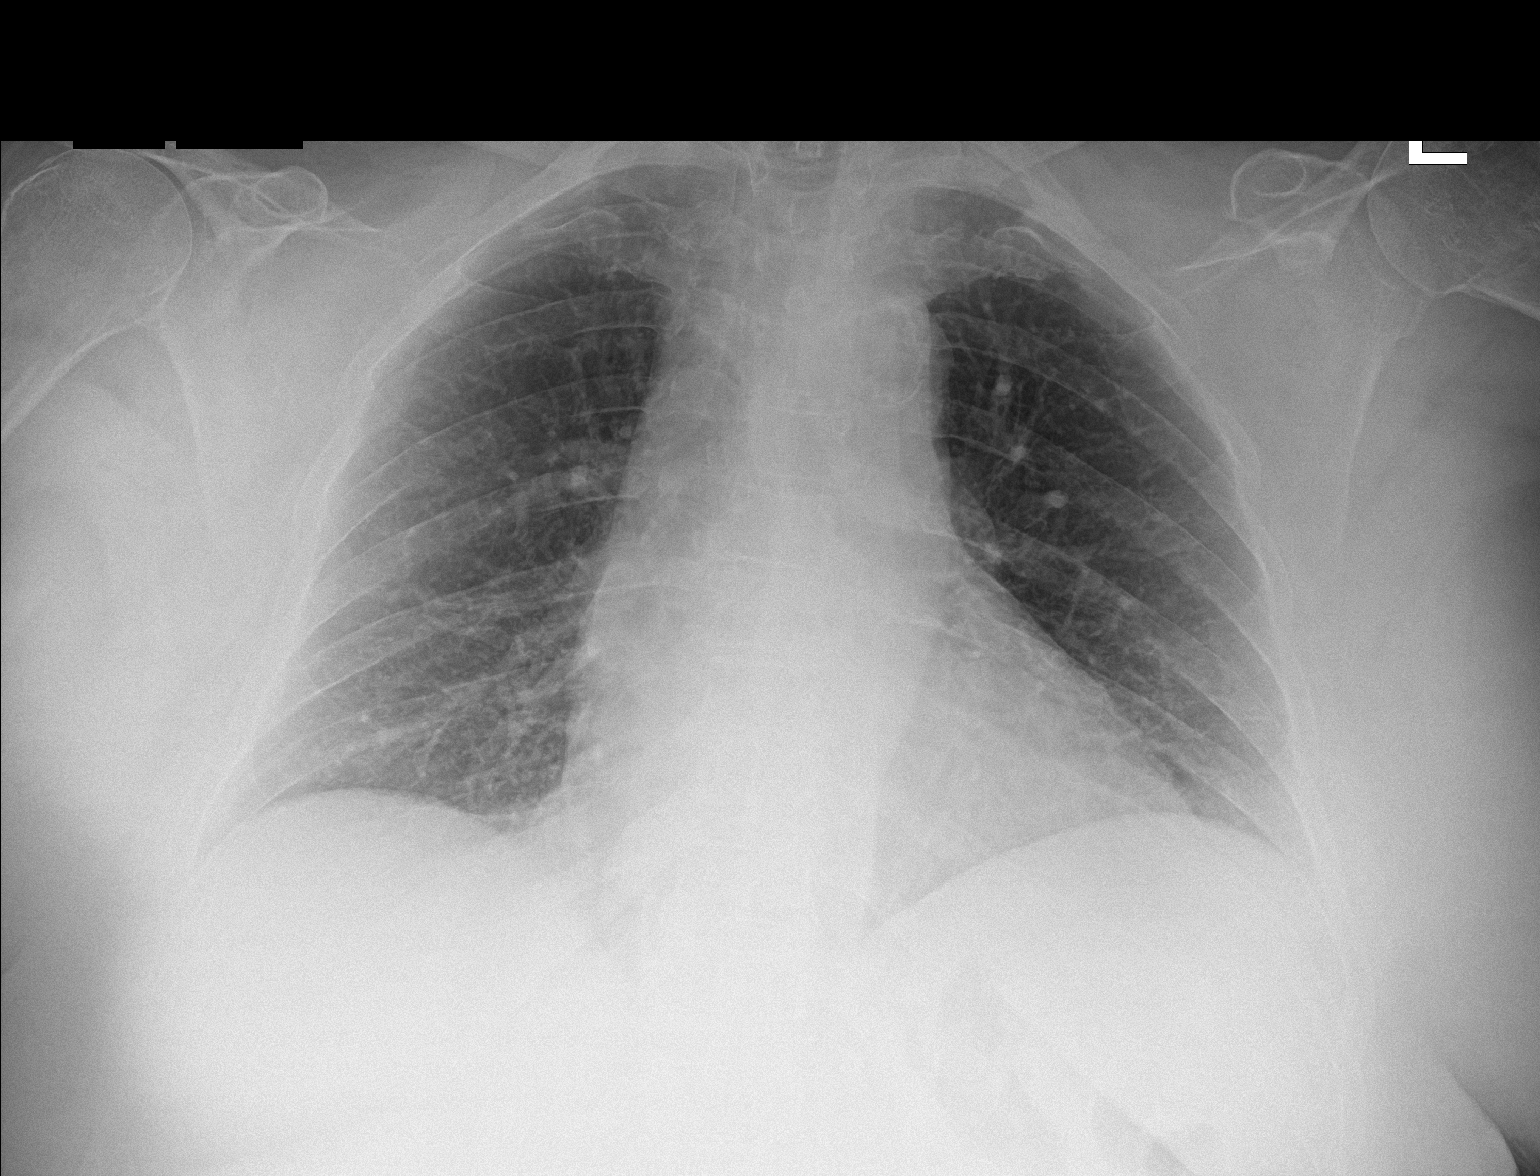

[1 of 1 positions shown; findings below may reference images not displayed]

FINDINGS: Cardiomediastinal silhouette is normal. Mediastinal contours appear
intact. Calcific atherosclerotic disease of the aorta.

There is no evidence of focal airspace consolidation, pleural
effusion or pneumothorax.

Osseous structures are without acute abnormality. Soft tissues are
grossly normal.
IMPRESSION: No active disease.

## 2020-09-23 ENCOUNTER — Ambulatory Visit (INDEPENDENT_AMBULATORY_CARE_PROVIDER_SITE_OTHER): Payer: Medicare Other | Admitting: Allergy & Immunology

## 2020-09-23 ENCOUNTER — Encounter: Payer: Self-pay | Admitting: Allergy & Immunology

## 2020-09-23 ENCOUNTER — Other Ambulatory Visit: Payer: Self-pay

## 2020-09-23 ENCOUNTER — Ambulatory Visit: Payer: Medicare Other | Admitting: Allergy & Immunology

## 2020-09-23 VITALS — BP 140/90 | HR 85 | Temp 98.1°F | Resp 18 | Ht 66.0 in | Wt 251.4 lb

## 2020-09-23 DIAGNOSIS — T7800XD Anaphylactic reaction due to unspecified food, subsequent encounter: Secondary | ICD-10-CM | POA: Diagnosis not present

## 2020-09-23 DIAGNOSIS — L508 Other urticaria: Secondary | ICD-10-CM | POA: Diagnosis not present

## 2020-09-23 DIAGNOSIS — T783XXD Angioneurotic edema, subsequent encounter: Secondary | ICD-10-CM

## 2020-09-23 NOTE — Patient Instructions (Addendum)
1. Angioedema - We are going to get factor XII genetic testing (this can be abnormal in patients with normal C1 esterase inhibitor level and function).  - We are going to get some labs to look for autoimmune causes of your swelling. - We are also getting an environmental allergy panel. - Xolair might be a consideration in the future to treat the hives/swelling. - We will be in touch over MyChart with the results of the testing.   2. Return in about 3 months (around 12/21/2020).   Please inform us of any Emergency Department visits, hospitalizations, or changes in symptoms. Call us before going to the ED for breathing or allergy symptoms since we might be able to fit you in for a sick visit. Feel free to contact us anytime with any questions, problems, or concerns.  It was a pleasure to meet you today!  Websites that have reliable patient information: 1. American Academy of Asthma, Allergy, and Immunology: www.aaaai.org 2. Food Allergy Research and Education (FARE): foodallergy.org 3. Mothers of Asthmatics: http://www.asthmacommunitynetwork.org 4. American College of Allergy, Asthma, and Immunology: www.acaai.org   COVID-19 Vaccine Information can be found at: [email or web address] For questions related to vaccine distribution or appointments, please email [email or web address] or call [phone].   We realize that you might be concerned about having an allergic reaction to the COVID19 vaccines. To help with that concern, WE ARE OFFERING THE COVID19 VACCINES IN OUR OFFICE! Ask the front desk for dates!     "Like" [place] on Facebook and Instagram for our latest updates!      A healthy democracy works best when [employer] participate! Make sure you are registered to vote! If you have moved or changed any of your contact information, you will need to get this updated before voting!  In some cases, you MAY be able to register to vote  online: [email or web address]

## 2020-09-23 NOTE — Progress Notes (Signed)
NEW PATIENT  Date of Service/Encounter:    Referring provider: Maud Deed, PA   Assessment:   Angioedema with urticaria - unknown trigger  Alpha gal allergy    It is unclear at this point what is causing [name] reactions.  I want to say that this has something to do with her alpha gal allergy, but I am not sure why she was suddenly start being sensitive to milk with her IgE to alpha gal actually going down.  We are going to recheck that today just to see where it is at.  I am also going to get a factor XII mutation analysis to see if this might be type III hereditary angioedema.  We are also going to look into some autoimmune causes of her symptoms.  I am not sure it is worth it to continue with antihistamines since this did not help her at all.  We could certainly look into doing Xolair in the future as a trial to see if they can help with the angioedema.  Plan/Recommendations:   1. Angioedema - We are going to get factor XII genetic testing (this can be abnormal in patients with normal C1 esterase inhibitor level and function).  - We are going to get some labs to look for autoimmune causes of your swelling. - We are also getting an environmental allergy panel. - Xolair might be a consideration in the future to treat the hives/swelling. - We will be in touch over MyChart with the results of the testing.   2. Return in about 3 months (around 12/21/2020).   Subjective:   Kathy Schaefer is a 60 y.o. female presenting today for evaluation of  Chief Complaint  Patient presents with   Angioedema    Kathy Schaefer has a history of the following: Patient Active Problem List   Diagnosis Date Noted   Polyarthralgia 02/17/2018   Encounter for long-term use of opiate analgesic 02/17/2018   Chronic pain disorder 02/17/2018   Cervical spondylosis without myelopathy 02/17/2018   Arthropathy of cervical facet joint 02/17/2018   Tobacco use disorder 02/03/2018    History of stroke 02/03/2018   Spondylosis of lumbar spine 01/04/2018   Pain of right heel 01/04/2018   DDD (degenerative disc disease), cervical 01/04/2018   Generalized OA 01/04/2018   Familial hypercholesterolemia 01/04/2018   High triglycerides 11/16/2013   Intervertebral disc protrusion 11/16/2013   Osteoarthritis 03/27/2013   Depression (emotion) 03/27/2013   GERD 06/13/2007   PLANTAR FASCIITIS 06/13/2007    History obtained from: chart review and patient.  Kathy Schaefer was referred by Maud Deed, PA.     Kathy Schaefer is a 60 y.o. female presenting for an evaluation of angioedema.  She started having issues on December 7th. She was get going what she thought was GERD. She is on omeprazole daily for this and has never had issues it. It got worse and worse. She took a second one. She only takes one per day. She has these swelling episodes every other day since December 2nd. She has had 4 days now without a problem.  This is her longest stretch in several months.  She was on three rounds of prednisone and two antihistamines twice daily for over one month. This did not help at all. There was no itching, urticaria, only swelling. She does not have fevers. She does have tongue swelling. She does show me pictures of what looks like a hive on her shoulder. She does have lip swelling  of unknown reasons. She is not on an ACE inhibitor. She does not use cosmetic. She does not use her soap or shampoo. She has had hives during some of these episodes, for instance the one on her shoulder.  Did have labs done in December 2021.  It showed a normal metabolic panel aside from an elevated creatinine.  Her alkaline phosphatase was slightly elevated at 173.  Thyroid studies were normal.  Tryptase was normal.  C4 was normal.  An alpha gal showed an IgE of 20.8.  C1 esterase inhibitor in the serum was normal.  C1q was normal.  There is no food triggers at all. She tolerates all of the major food  allergens without adverse event. The only she is has is alpha gal. This was diagnosed 5 years. A repeat alpha gal level was 20.8. This was diagnosed at [facility]. Original IgE level was 93.2 in July 2017 and her total IgE was 509. She introduced red meat back into her diet two years in to the diagnosis and it was "bad".  She can do dairy and does not do a lot. She mostly drinks soy milk. She was off of all dairy for six months. Then she tried it back and was fine. She is not doing a couple of capsules (two of them are in capsule forms including gabapentin and omeprazole).   She does not have itchy watery eyes or runny nose or sneezing. 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 She does get some hives from this.  She has never been tested for environmental allergies.  She did have a gallbladder site infection from her recent surgery. She was placed on IV antibiotics for this infection. She went to the ED on December 13th.  This is her only infection for the most part.  There is no family history of immunodeficiencies.  Otherwise, there is no history of other atopic diseases, including asthma, drug allergies, stinging insect allergies or contact dermatitis. Thre is no significant infectious history. Vaccinations are up to date.               Past Medical History: Patient Active Problem List   Diagnosis Date Noted   Polyarthralgia 02/17/2018   Encounter for long-term use of opiate analgesic 02/17/2018   Chronic pain disorder 02/17/2018   Cervical spondylosis without myelopathy 02/17/2018   Arthropathy of cervical facet joint 02/17/2018   Tobacco use disorder 02/03/2018   History of stroke 02/03/2018   Spondylosis of lumbar spine 01/04/2018   Pain of right heel 01/04/2018   DDD (degenerative disc disease), cervical 01/04/2018   Generalized OA 01/04/2018   Familial hypercholesterolemia 01/04/2018   High triglycerides 11/16/2013   Intervertebral  disc protrusion 11/16/2013   Osteoarthritis 03/27/2013   Depression (emotion) 03/27/2013   GERD 06/13/2007   PLANTAR FASCIITIS 06/13/2007    Medication List:  Allergies as of 09/23/2020      Reactions   Alpha-gal Anaphylaxis   BEEF, LAMB, PORK   Phenylpropanolamine Hcl Swelling   SWELLING REACTION UNSPECIFIED    Guaifenesin Anxiety, Other (See Comments)   Every nerve in body was on edge  Unable to describe per patient   Cyclobenzaprine Hcl Other (See Comments)   Headache   Zyrtec [cetirizine] Palpitations      Medication List       Accurate as of September 23, 2020 11:59 PM. If you have any questions, ask your nurse or doctor.  STOP taking these medications   Co Q-10 100 MG Caps Stopped by: Alfonse SpruceJoel Louis Dhairya Corales, MD   GLUCOSAMINE-CHONDROITIN PO Stopped by: Alfonse SpruceJoel Louis Cloyce Paterson, MD   Lecithin 1200 MG Caps Stopped by: Alfonse SpruceJoel Louis Keijuan Schellhase, MD     TAKE these medications   amitriptyline 25 MG tablet Commonly known as: ELAVIL TAKE ONE TABLET BY MOUTH AT BEDTIME What changed:   how much to take  how to take this  when to take this  additional instructions   ARNICA EX Apply 1 application topically daily as needed (for pain).   aspirin EC 81 MG tablet Take 81 mg by mouth daily.   diclofenac 75 MG EC tablet Commonly known as: VOLTAREN Take 1 tablet (75 mg total) by mouth 2 (two) times daily.   gabapentin 300 MG capsule Commonly known as: Neurontin Take 1 capsule (300 mg total) by mouth 3 (three) times daily. What changed: when to take this   hydrochlorothiazide 25 MG tablet Commonly known as: HYDRODIURIL Take 25 mg by mouth daily.   omeprazole 20 MG capsule Commonly known as: PRILOSEC Take 20 mg by mouth daily.   tiZANidine 4 MG tablet Commonly known as: ZANAFLEX TAKE 1 TABLET BY MOUTH THREE TIMES DAILY   traMADol 50 MG tablet Commonly known as: ULTRAM Take by mouth as needed.   trolamine salicylate 10 % cream Commonly known as:  ASPERCREME Apply 1 application topically daily as needed for muscle pain.       Birth History: non-contributory  Developmental History: non-contributory  Past Surgical History: Past Surgical History:  Procedure Laterality Date   COLONOSCOPY     MOUTH SURGERY  03/17/2018   removed 8 bottom teeth   TOOTH EXTRACTION N/A 03/17/2018   Procedure: DENTAL EXTRACTIONS TEETH NUMBER TWENTY ONE, TWENTY TWO, TWENTY THREE, TWENTY FOUR, TWENTY FIVE, TWENTY SIX, TWENTY SEVEN, TWENTY EIGHT, ALVEOLOPLASTY;  Surgeon: Ocie DoyneJensen, Scott, DDS;  Location: [facility] OR;  Service: Oral Surgery;  Laterality: N/A;     Family History: Family History  Problem Relation Age of Onset   Emphysema Mother    Diabetes Mother    Heart disease Mother    Arthritis Mother    Pancreatic cancer Father    Colon cancer Father    ALS Sister    Hyperlipidemia Brother    Esophageal cancer Neg Hx    Rectal cancer Neg Hx    Stomach cancer Neg Hx    Colon polyps Neg Hx    Allergic rhinitis Neg Hx    Angioedema Neg Hx    Asthma Neg Hx    Eczema Neg Hx    Immunodeficiency Neg Hx    Urticaria Neg Hx      Social History: Lurena JoinerRebecca lives at home in a house that is 6660 to 61 years old.  There is carpeting throughout the home.  They have gas heating and central cooling.  There are parakeets inside of the house.  There are no dust mite covers in the bedding.  There is no tobacco exposure.  She is not exposed to fumes, chemicals, or dust.  She does not use a HEPA filter.  She has never been a smoker.  Review of Systems  Constitutional: Negative.  Negative for chills, fever, malaise/fatigue and weight loss.  HENT: Negative for congestion, ear discharge, ear pain, sinus pain and sore throat.   Eyes: Negative for pain, discharge and redness.  Respiratory: Negative for cough, sputum production, shortness of breath and wheezing.   Cardiovascular: Negative.  Negative for chest  pain and palpitations.  Gastrointestinal:  Negative for abdominal pain, constipation, diarrhea, heartburn, nausea and vomiting.  Skin: Positive for itching and rash.       Positive for angioedema.  Neurological: Negative for dizziness and headaches.  Endo/Heme/Allergies: Negative for environmental allergies. Does not bruise/bleed easily.       Objective:   Blood pressure 140/90, pulse 85, temperature 98.1 F (36.7 C), temperature source Temporal, resp. rate 18, height 5\' 6"  (1.676 m), weight 251 lb 6.4 oz (114 kg), SpO2 96 %. Body mass index is 40.58 kg/m.   Physical Exam:   Physical Exam Constitutional:      Appearance: She is well-developed.  HENT:     Head: Normocephalic and atraumatic.     Right Ear: Tympanic membrane, ear canal and external ear normal. No drainage, swelling or tenderness. Tympanic membrane is not injected, scarred, erythematous, retracted or bulging.     Left Ear: Tympanic membrane, ear canal and external ear normal. No drainage, swelling or tenderness. Tympanic membrane is not injected, scarred, erythematous, retracted or bulging.     Nose: No nasal deformity, septal deviation, mucosal edema, rhinorrhea or epistaxis.     Right Turbinates: Enlarged and swollen.     Left Turbinates: Enlarged and swollen.     Right Sinus: No maxillary sinus tenderness or frontal sinus tenderness.     Left Sinus: No maxillary sinus tenderness or frontal sinus tenderness.     Mouth/Throat:     Mouth: Oropharynx is clear and moist. Mucous membranes are not pale and not dry.     Pharynx: Uvula midline.  Eyes:     General:        Right eye: No discharge.        Left eye: No discharge.     Extraocular Movements: EOM normal.     Conjunctiva/sclera: Conjunctivae normal.     Right eye: Right conjunctiva is not injected. No chemosis.    Left eye: Left conjunctiva is not injected. No chemosis.    Pupils: Pupils are equal, round, and reactive to light.  Cardiovascular:     Rate and Rhythm: Normal rate and regular rhythm.      Heart sounds: Normal heart sounds.  Pulmonary:     Effort: Pulmonary effort is normal. No tachypnea, accessory muscle usage or respiratory distress.     Breath sounds: Normal breath sounds. No wheezing, rhonchi or rales.     Comments: Moving air well in all lung fields.  No increased work of breathing. Chest:     Chest wall: No tenderness.  Abdominal:     Tenderness: There is no abdominal tenderness. There is no guarding or rebound.  Lymphadenopathy:     Head:     Right side of head: No submandibular, tonsillar or occipital adenopathy.     Left side of head: No submandibular, tonsillar or occipital adenopathy.     Cervical: No cervical adenopathy.  Skin:    General: Skin is warm.     Capillary Refill: Capillary refill takes less than 2 seconds.     Coloration: Skin is not pale.     Findings: No abrasion, erythema, petechiae or rash. Rash is not papular, urticarial or vesicular.     Comments: Resolving urticarial lesion on the right shoulder.  No angioedema appreciated today.  Neurological:     Mental Status: She is alert.  Psychiatric:        Mood and Affect: Mood and affect normal.      Diagnostic studies: labs sent  instead         Malachi Bonds, MD [facility]

## 2020-09-25 ENCOUNTER — Encounter: Payer: Self-pay | Admitting: Allergy & Immunology

## 2020-09-27 LAB — IGE+ALLERGENS ZONE 2(30)
Alternaria Alternata IgE: <0.1 kU/L
Amer Sycamore IgE Qn: 2.1 kU/L — AB
Aspergillus Fumigatus IgE: <0.1 kU/L
Bahia Grass IgE: 2.14 kU/L — AB
Bermuda Grass IgE: 2.01 kU/L — AB
Cat Dander IgE: 0.73 kU/L — AB
Cedar, Mountain IgE: 1.66 kU/L — AB
Cladosporium Herbarum IgE: <0.1 kU/L
Cockroach, American IgE: 1.14 kU/L — AB
Common Silver Birch IgE: 1.45 kU/L — AB
D Farinae IgE: 0.64 kU/L — AB
D Pteronyssinus IgE: 0.14 kU/L — AB
Dog Dander IgE: 0.58 kU/L — AB
Elm, American IgE: 1.88 kU/L — AB
Hickory, White IgE: 1.98 kU/L — AB
IgE (Immunoglobulin E), Serum: 213 IU/mL (ref 6–495)
Johnson Grass IgE: 2.08 kU/L — AB
Maple/Box Elder IgE: 2.05 kU/L — AB
Mucor Racemosus IgE: 1.51 kU/L — AB
Mugwort IgE Qn: 1.41 kU/L — AB
Nettle IgE: 1.74 kU/L — AB
Oak, White IgE: 1.9 kU/L — AB
Penicillium Chrysogen IgE: <0.1 kU/L
Pigweed, Rough IgE: 1.95 kU/L — AB
Plantain, English IgE: 1.94 kU/L — AB
Ragweed, Short IgE: 2.13 kU/L — AB
Sheep Sorrel IgE Qn: 2.01 kU/L — AB
Stemphylium Herbarum IgE: 0.11 kU/L — AB
Sweet gum IgE RAST Ql: 2.08 kU/L — AB
Timothy Grass IgE: 2.06 kU/L — AB
White Mulberry IgE: 1.57 kU/L — AB

## 2020-09-27 LAB — ANTINUCLEAR ANTIBODIES, IFA: ANA Titer 1: NEGATIVE

## 2020-09-27 LAB — FACTOR 12 ASSAY: Factor XII Activity: 161 % — ABNORMAL HIGH (ref 50–150)

## 2020-09-27 LAB — SEDIMENTATION RATE: Sed Rate: 26 mm/hr (ref 0–40)

## 2020-09-27 LAB — C-REACTIVE PROTEIN: CRP: 4 mg/L (ref 0–10)

## 2020-09-30 ENCOUNTER — Encounter: Payer: Self-pay | Admitting: Allergy & Immunology

## 2020-10-16 ENCOUNTER — Telehealth: Payer: Self-pay | Admitting: *Deleted

## 2020-10-16 NOTE — Telephone Encounter (Signed)
Patient called and advised hives have returned and now wants to start Xolair.  I have obtained approval and will submit today and reach out once delivery is set to make appt to start therapy

## 2020-10-16 NOTE — Telephone Encounter (Signed)
Advised patient of approval and submit for Xolair

## 2020-10-17 NOTE — Telephone Encounter (Signed)
Sounds good!  Thank you!  ° °Joel Gallagher, MD °[facility] ° °

## 2020-10-24 NOTE — Telephone Encounter (Signed)
Patient called and states she received a call from the specialty pharmacy. She states they were asking her if she wanted the xolair mailed to her and she did not know what to say. She would like a call back.

## 2020-10-25 DIAGNOSIS — M542 Cervicalgia: Secondary | ICD-10-CM | POA: Insufficient documentation

## 2020-10-27 ENCOUNTER — Telehealth: Payer: Self-pay | Admitting: *Deleted

## 2020-10-27 NOTE — Telephone Encounter (Signed)
Patient called and stated that the pharmacy called her to set up delivery for her Xolair. She stated that she told the pharmacy to ship the Xolair to our [place] office but she did not know when it would be delivered. I advised that as soon as it comes in we will call her and get her on the schedule to come in and start Xolair. Patient verbalized understanding.

## 2020-10-27 NOTE — Telephone Encounter (Signed)
Spoke to [facility] and they are due to deliver Xolair 3/23 to office. Called and advised patient of same and to contact office 3/23 pm to see if same arrived and schedule appt to start therapy

## 2020-10-27 NOTE — Telephone Encounter (Signed)
[facility] called and her Xolair will be shipped this Wednesday 10/29/2020 to our office. Called the patient and scheduled for her to come in and start injections.

## 2020-10-31 ENCOUNTER — Ambulatory Visit (INDEPENDENT_AMBULATORY_CARE_PROVIDER_SITE_OTHER): Payer: Medicare Other | Admitting: *Deleted

## 2020-10-31 DIAGNOSIS — L501 Idiopathic urticaria: Secondary | ICD-10-CM

## 2020-10-31 MED ORDER — OMALIZUMAB 150 MG ~~LOC~~ SOLR
300.0000 mg | SUBCUTANEOUS | Status: AC
  Administered 2020-10-31 – 2021-01-30 (×4): 300 mg via SUBCUTANEOUS

## 2020-11-26 ENCOUNTER — Telehealth: Payer: Self-pay

## 2020-11-26 NOTE — Telephone Encounter (Signed)
Patient called and stated that she has an upcoming Xolair appoint this coming Friday. Patient is concerned about getting it due to congestion, runny nose, and cough. Patient said she thought it was allergies but it has lasted more than a week and she has seen no improvements. Please advise on if she should keep her scheduled Xolair appointment or come in for a OV due to not feeling her best, Thank you.

## 2020-11-26 NOTE — Telephone Encounter (Signed)
Pt called back to follow up on earlier message, pt decided to push out xolair to next week and has scheduled a telehealth visit for tomorrow with Thurston Hole @ 3:30.

## 2020-11-26 NOTE — Telephone Encounter (Signed)
The symptoms could still be her allergies if she has pollen allergy in particular as the symptoms can just be ongoing with spring season. Is she taking any allergy medications for the symptoms?  She having any fevers?  Has she been around anyone with COVID?  Has she done a rapid COVID test?    It might be best to push her Xolair out till next week.  If she would like to come in for a visit to discuss her current symptoms then that can be scheduled.

## 2020-11-27 ENCOUNTER — Other Ambulatory Visit: Payer: Self-pay

## 2020-11-27 ENCOUNTER — Encounter: Payer: Self-pay | Admitting: Family Medicine

## 2020-11-27 ENCOUNTER — Ambulatory Visit (INDEPENDENT_AMBULATORY_CARE_PROVIDER_SITE_OTHER): Payer: Medicare Other | Admitting: Family Medicine

## 2020-11-27 VITALS — Ht 66.0 in | Wt 240.0 lb

## 2020-11-27 DIAGNOSIS — J0100 Acute maxillary sinusitis, unspecified: Secondary | ICD-10-CM | POA: Diagnosis not present

## 2020-11-27 DIAGNOSIS — T7800XD Anaphylactic reaction due to unspecified food, subsequent encounter: Secondary | ICD-10-CM

## 2020-11-27 DIAGNOSIS — J3089 Other allergic rhinitis: Secondary | ICD-10-CM | POA: Diagnosis not present

## 2020-11-27 DIAGNOSIS — H1013 Acute atopic conjunctivitis, bilateral: Secondary | ICD-10-CM | POA: Diagnosis not present

## 2020-11-27 DIAGNOSIS — H1010 Acute atopic conjunctivitis, unspecified eye: Secondary | ICD-10-CM

## 2020-11-27 DIAGNOSIS — T783XXD Angioneurotic edema, subsequent encounter: Secondary | ICD-10-CM

## 2020-11-27 DIAGNOSIS — L508 Other urticaria: Secondary | ICD-10-CM

## 2020-11-27 DIAGNOSIS — J302 Other seasonal allergic rhinitis: Secondary | ICD-10-CM

## 2020-11-27 MED ORDER — PREDNISONE 10 MG PO TABS
ORAL_TABLET | ORAL | 0 refills | Status: DC
Start: 2020-11-27 — End: 2020-11-27

## 2020-11-27 MED ORDER — PREDNISONE 10 MG PO TABS
ORAL_TABLET | ORAL | 0 refills | Status: DC
Start: 2020-11-27 — End: 2022-12-16

## 2020-11-27 MED ORDER — EPINEPHRINE 0.3 MG/0.3ML IJ SOAJ: 0.3000 mg | INTRAMUSCULAR | 2 refills | Status: DC | PRN

## 2020-11-27 NOTE — Patient Instructions (Addendum)
Acute sinusitis Begin prednisone 10 mg tablets. Take 2 tablets twice a day for 2 days, then take 2 tablets once a day for 2 days, then take 1 tablet on the 5th day, then stop Begin nasal saline rinses twice a day Begin Flonase nasal spray 2 sprays in each nostril once a day.  In the right nostril, point the applicator out toward the right ear. In the left nostril, point the applicator out toward the left ear Call the clinic if your symptoms worsen or if you develop a fever.   Allergic rhinitis Continue allergen avoidance measures directed toward pollens, mold, pets, dust mites, and cockroach Stop Xyzal for about 1 week, then you may take Xyzal 5 mg once a day as needed for a runny nose Continue Flonase 2 sprays in each nostril once a day as needed for a stuffy nose  Allergic conjunctivitis Some over the counter eye drops include Pataday one drop in each eye once a day as needed for red, itchy eyes OR Zaditor one drop in each eye twice a day as needed for red itchy eyes.  Hives/angioedema Once you are feeling better, continue Xolair injections once every 28 days and have access to an epinephrine auto-injector set.  If your symptoms re-occur, begin a journal of events that occurred for up to 6 hours before your symptoms began including foods and beverages consumed, soaps or perfumes you had contact with, and medications.   Alpha gal Continue to avoid mammalian products.  In case of an allergic reaction, take Benadryl 50 mg every 4 hours, and if life-threatening symptoms occur, inject with EpiPen 0.3 mg.  Call the clinic if this treatment plan is not working well for you  Follow up in 2 months or sooner if needed.

## 2020-11-27 NOTE — Progress Notes (Addendum)
RE: Kathy Schaefer MRN: [id number] DOB: 08-18-1960 Date of Telemedicine Visit: 11/27/2020  Referring provider: Maud Deed, PA Primary care provider: Maud Schaefer, [place]  Chief Complaint: Other (Had an appointment for 2nd zolair shot ) and Allergic Rhinitis  (Post nasal drip, congestion, sinus pressure, head ( ears, nose, face) itching,/Has been taking zyzal, zyrtec causes heart palpitations )   Telemedicine Follow Up Visit via Telephone: I connected with Kathy Schaefer for a follow up on 11/27/20 by telephone and verified that I am speaking with the correct person using two identifiers.   I discussed the limitations, risks, security and privacy concerns of performing an evaluation and management service by telephone and the availability of in person appointments. I also discussed with the patient that there may be a patient responsible charge related to this service. The patient expressed understanding and agreed to proceed.  Patient is at home Provider is at the office.  Visit start time: 348 Visit end time: 81 Insurance consent/check in by: [facility] consent and medical assistant/nurse: Diandra  History of Present Illness: She is a 60 y.o. female, who is being followed for angioedema, hives, alpha gal allergy, and allergic rhinitis. Her previous allergy office visit was on 09/23/2020 with Dr. Dellis Anes.  At today's visit, she reports that, about 12 days ago she began to experience symptoms including sneezing, clear watery nasal drainage, pruritus, and occular pruritus. She began taking a Xyzal 5 mg tablet twice a day and about 2 days ago she began to experience different symptoms including nasal congestion, thick yellow nasal drainage, nasal congestion, thick post nasal drainage, and sinus pressure. She denies cough, fever, sweats, chills, and sick contacts. Tarry reports that she has moved from [place] to [place] one year ago. She reports that she has never experienced symptoms  of allergies before 12 days ago. She continues to avoid mammalian products and reports that Xolair has been controlling hives and swelling. Her current medications are listed in the chart.   Assessment and Plan: [name] is a 60 y.o. female with: Patient Instructions  Acute sinusitis Begin prednisone 10 mg tablets. Take 2 tablets twice a day for 2 days, then take 2 tablets once a day for 2 days, then take 1 tablet on the 5th day, then stop Begin nasal saline rinses twice a day Begin Flonase nasal spray 2 sprays in each nostril once a day.  In the right nostril, point the applicator out toward the right ear. In the left nostril, point the applicator out toward the left ear Call the clinic if your symptoms worsen or if you develop a fever.  Allergic rhinitis Continue allergen avoidance measures directed toward pollens, mold, pets, dust mites, and cockroach as listed below Stop Xyzal for about 1 week, then you may take Xyzal 5 mg once a day as needed for a runny nose Continue Flonase 2 sprays in each nostril once a day as needed for a stuffy nose  Allergic conjunctivitis Some over the counter eye drops include Pataday one drop in each eye once a day as needed for red, itchy eyes OR Zaditor one drop in each eye twice a day as needed for red itchy eyes. Hives/angioedema Once you are feeling better, continue Xolair injections once every 28 days and have access to an epinephrine auto-injector set. If your symptoms re-occur, begin a journal of events that occurred for up to 6 hours before your symptoms began including foods and beverages consumed, soaps or perfumes you had contact with, and medications.  Alpha gal Continue to avoid mammalian products.  In case of an allergic reaction, take Benadryl 50 mg every 4 hours, and if life-threatening symptoms occur, inject with EpiPen 0.3 mg.  Call the clinic if this treatment plan is not working well for you  Follow up in 2 months or sooner if  needed.    Return in about 2 months (around 01/27/2021), or if symptoms worsen or fail to improve.  Meds ordered this encounter  Medications  . DISCONTD: predniSONE (DELTASONE) 10 MG tablet    Sig: Take 2 in the evening Take 2 at breakfast and 2 at lunch for 2 days  Take 2 at breakfast  Take 1 at breakfast    Dispense:  13 tablet    Refill:  0  . predniSONE (DELTASONE) 10 MG tablet    Sig: Take 2 in the evening Take 2 at breakfast and 2 at lunch for 2 days  Take 2 at breakfast  Take 1 at breakfast    Dispense:  13 tablet    Refill:  0  . EPINEPHrine (EPIPEN 2-PAK) 0.3 mg/0.3 mL IJ SOAJ injection    Sig: Inject 0.3 mg into the muscle as needed for anaphylaxis.    Dispense:  1 each    Refill:  2    Medication List:  Current Outpatient Medications  Medication Sig Dispense Refill  . amitriptyline (ELAVIL) 25 MG tablet TAKE ONE TABLET BY MOUTH AT BEDTIME (Patient taking differently: Take 25 mg by mouth at bedtime.) 30 tablet 3  . ARNICA EX Apply 1 application topically daily as needed (for pain).    Marland Kitchen aspirin EC 81 MG tablet Take 81 mg by mouth daily.    . diclofenac (VOLTAREN) 75 MG EC tablet Take 1 tablet (75 mg total) by mouth 2 (two) times daily. 60 tablet 3  . EPINEPHrine (EPIPEN 2-PAK) 0.3 mg/0.3 mL IJ SOAJ injection Inject 0.3 mg into the muscle as needed for anaphylaxis. 1 each 2  . hydrochlorothiazide (HYDRODIURIL) 25 MG tablet Take 25 mg by mouth daily.    Marland Kitchen levocetirizine (XYZAL) 5 MG tablet Take 10 mg by mouth 2 (two) times daily as needed.    Marland Kitchen omeprazole (PRILOSEC) 20 MG capsule Take 20 mg by mouth daily.    Marland Kitchen tiZANidine (ZANAFLEX) 4 MG tablet TAKE 1 TABLET BY MOUTH THREE TIMES DAILY    . traMADol (ULTRAM) 50 MG tablet Take by mouth as needed.    . trolamine salicylate (ASPERCREME) 10 % cream Apply 1 application topically daily as needed for muscle pain.    Marland Kitchen gabapentin (NEURONTIN) 300 MG capsule Take 1 capsule (300 mg total) by mouth 3 (three) times daily. (Patient  taking differently: Take 300 mg by mouth at bedtime.) 90 capsule 0  . predniSONE (DELTASONE) 10 MG tablet Take 2 in the evening Take 2 at breakfast and 2 at lunch for 2 days  Take 2 at breakfast  Take 1 at breakfast 13 tablet 0   Current Facility-Administered Medications  Medication Dose Route Frequency Provider Last Rate Last Admin  . omalizumab Geoffry Paradise) injection 300 mg  300 mg Subcutaneous Q28 days Marcelyn Bruins, MD   300 mg at 10/31/20 1448   Allergies: Allergies  Allergen Reactions  . Alpha-Gal Anaphylaxis    BEEF, LAMB, PORK  . Phenylpropanolamine Hcl Swelling    SWELLING REACTION UNSPECIFIED   . Guaifenesin Anxiety and Other (See Comments)    Every nerve in body was on edge  Unable to describe per patient  .  Cyclobenzaprine Hcl Other (See Comments)    Headache  . Zyrtec [Cetirizine] Palpitations   I reviewed her past medical history, social history, family history, and environmental history and no significant changes have been reported from previous visit on 09/23/2020.  Review of Systems  Constitutional: Negative.   HENT: Positive for congestion, postnasal drip, rhinorrhea, sinus pressure and sneezing.   Eyes: Positive for discharge and itching.  Respiratory: Negative.   Musculoskeletal: Negative.   Skin: Negative.    Objective: Physical Exam Not obtained as encounter was done via telephone.   Previous notes and tests were reviewed.  I discussed the assessment and treatment plan with the patient. The patient was provided an opportunity to ask questions and all were answered. The patient agreed with the plan and demonstrated an understanding of the instructions.   The patient was advised to call back or seek an in-person evaluation if the symptoms worsen or if the condition fails to improve as anticipated.  I provided 22 minutes of non-face-to-face time during this encounter.  It was my pleasure to participate in Cassidy Tashiro care today. Please feel  free to contact me with any questions or concerns.   Sincerely,  Thermon Leyland, FNP

## 2020-11-28 ENCOUNTER — Ambulatory Visit: Payer: Self-pay

## 2020-12-03 ENCOUNTER — Ambulatory Visit: Payer: Medicare Other

## 2020-12-04 ENCOUNTER — Ambulatory Visit (INDEPENDENT_AMBULATORY_CARE_PROVIDER_SITE_OTHER): Payer: Medicare Other | Admitting: *Deleted

## 2020-12-04 ENCOUNTER — Other Ambulatory Visit: Payer: Self-pay

## 2020-12-04 DIAGNOSIS — L501 Idiopathic urticaria: Secondary | ICD-10-CM | POA: Diagnosis not present

## 2020-12-04 DIAGNOSIS — L508 Other urticaria: Secondary | ICD-10-CM

## 2020-12-22 DIAGNOSIS — T783XXA Angioneurotic edema, initial encounter: Secondary | ICD-10-CM | POA: Insufficient documentation

## 2020-12-22 DIAGNOSIS — E669 Obesity, unspecified: Secondary | ICD-10-CM | POA: Insufficient documentation

## 2020-12-22 DIAGNOSIS — I10 Essential (primary) hypertension: Secondary | ICD-10-CM | POA: Insufficient documentation

## 2021-01-01 ENCOUNTER — Other Ambulatory Visit: Payer: Self-pay

## 2021-01-01 ENCOUNTER — Ambulatory Visit (INDEPENDENT_AMBULATORY_CARE_PROVIDER_SITE_OTHER): Payer: Medicare Other | Admitting: *Deleted

## 2021-01-01 DIAGNOSIS — L501 Idiopathic urticaria: Secondary | ICD-10-CM | POA: Diagnosis not present

## 2021-01-01 DIAGNOSIS — L508 Other urticaria: Secondary | ICD-10-CM

## 2021-01-02 ENCOUNTER — Telehealth: Payer: Self-pay | Admitting: *Deleted

## 2021-01-02 NOTE — Telephone Encounter (Signed)
Patient lives an hour away and wants to self admin her injections. Storage and contact info for [facility] given and patient with instrux to bring same in to her next appt for instrux

## 2021-01-29 ENCOUNTER — Ambulatory Visit: Payer: Self-pay

## 2021-01-30 ENCOUNTER — Other Ambulatory Visit: Payer: Self-pay

## 2021-01-30 ENCOUNTER — Ambulatory Visit (INDEPENDENT_AMBULATORY_CARE_PROVIDER_SITE_OTHER): Payer: Medicare Other | Admitting: Allergy

## 2021-01-30 DIAGNOSIS — L501 Idiopathic urticaria: Secondary | ICD-10-CM | POA: Diagnosis not present

## 2021-01-30 DIAGNOSIS — L508 Other urticaria: Secondary | ICD-10-CM

## 2021-01-30 NOTE — Progress Notes (Signed)
Patient was instructed how to give home injections. I administered one injection as a demonstration and patient administered one injection herself while I watched her technique. Patient was informedto cntact or office with any questions or concerns and she agreed to do so.

## 2021-03-31 ENCOUNTER — Telehealth: Payer: Medicare Other

## 2021-03-31 NOTE — Telephone Encounter (Signed)
[facility] called in regards to a prior authorization for the patient's Xolair injections   Cover my Meds: [id number]  [facility] call back number:[phone]

## 2021-04-06 ENCOUNTER — Telehealth: Payer: Self-pay

## 2021-04-06 NOTE — Telephone Encounter (Signed)
Pharmacy called to follow up for Xolair prior authorization I transferred them over to [facility] and gave them her number just in case.

## 2021-04-16 DIAGNOSIS — I77811 Abdominal aortic ectasia: Secondary | ICD-10-CM | POA: Insufficient documentation

## 2021-05-20 DIAGNOSIS — D649 Anemia, unspecified: Secondary | ICD-10-CM | POA: Insufficient documentation

## 2021-07-08 ENCOUNTER — Other Ambulatory Visit: Payer: Self-pay

## 2021-07-08 ENCOUNTER — Ambulatory Visit: Payer: Medicare Other | Admitting: Gastroenterology

## 2021-07-08 ENCOUNTER — Ambulatory Visit (INDEPENDENT_AMBULATORY_CARE_PROVIDER_SITE_OTHER): Payer: Medicare Other | Admitting: Gastroenterology

## 2021-07-08 ENCOUNTER — Encounter: Payer: Self-pay | Admitting: Gastroenterology

## 2021-07-08 VITALS — BP 141/83 | HR 99 | Ht 66.0 in | Wt 245.6 lb

## 2021-07-08 DIAGNOSIS — K3184 Gastroparesis: Secondary | ICD-10-CM

## 2021-07-08 DIAGNOSIS — R112 Nausea with vomiting, unspecified: Secondary | ICD-10-CM

## 2021-07-08 DIAGNOSIS — M545 Low back pain, unspecified: Secondary | ICD-10-CM | POA: Insufficient documentation

## 2021-07-08 NOTE — Progress Notes (Signed)
Gastroenterology Consultation  Referring Provider:     Macy Mis, MD Primary Care Physician:  Maud Deed, [place] Primary Gastroenterologist:  Dr. Servando Snare     Reason for Consultation:     Diverticulosis and irritable bowel syndrome with constipation        HPI:   Kathy Schaefer is a 60 y.o. y/o female referred for consultation & management of diverticulosis with irritable bowel syndrome and constipation by Dr. Mancel Bale, Cristie Hem, PA.  This patient comes to see me after being seen previously by other gastroenterologist including Dr. Concha Se in [place] with a colonoscopy by him in 2020 with diverticulosis and internal hemorrhoids.  The patient was seen in 2021 at [facility] gastroenterology [facility]) for what was thought to be nondiabetic gastroparesis.  The patient was also in contact in April this year by care provider at [facility].  Prior to that the patient was seen in [place] by Dr. Joseph Art for change in bowel habits.  In July 2021 and October 2022 the primary care provider had referred the patient to low [facility] in [place] with the most recent referral to this office was 10 days after the referral in October.  The patient had a gastric emptying study that showed:  The stomach is located normally in the left upper abdomen.   Solid gastric emptying at 60 minutes is 37% (normal range at 60 minutes is  >10%).  Solid gastric emptying at 120 minutes is 49% (normal range at 120 minutes  is >40%).  Solid gastric emptying at 241 minutes is 71% (normal range at 240 minutes  is >90%).   Impression:  Normal solid gastric emptying study at 60 and 120 minutes; delayed solid  gastric emptying study at 241 minutes; see above discussion.    The patient reports that she was not happy with the care she has gotten in the past and states that her gastric emptying study was only done for 3 hours and she is unsure why the report said 4 hours.  She reports that she is doing well except  has some days that she feels are worse and states that she believes her gastroparesis is flaring up at those times.  She also comes here today to establish care with me as her new gastrologist.  There is no report of any unexplained weight loss in the patient states she actually is very little but continues to maintain her weight if not gained a little weight.  Past Medical History:  Diagnosis Date   Abdominal aortic aneurysm (AAA) (HCC)    ectatic abdominal aorta 2016   Allergy to alpha-gal    Angio-edema    DDD (degenerative disc disease), lumbar    and cervical per pt   Degenerative disc disease    Depression    pt denies   GERD (gastroesophageal reflux disease)    Head ache    History of hiatal hernia    years ago   Hyperlipidemia    Hypoglycemia    when she doesn"t eat or have protein   Neuropathy    Stroke (HCC) 2018   TIA    Past Surgical History:  Procedure Laterality Date   COLONOSCOPY     MOUTH SURGERY  03/17/2018   removed 8 bottom teeth   TOOTH EXTRACTION N/A 03/17/2018   Procedure: DENTAL EXTRACTIONS TEETH NUMBER TWENTY ONE, TWENTY TWO, TWENTY THREE, TWENTY FOUR, TWENTY FIVE, TWENTY SIX, TWENTY SEVEN, TWENTY EIGHT, ALVEOLOPLASTY;  Surgeon: Ocie Doyne, DDS;  Location:  [facility] OR;  Service: Oral Surgery;  Laterality: N/A;    Prior to Admission medications   Medication Sig Start Date End Date Taking? Authorizing Provider  amitriptyline (ELAVIL) 25 MG tablet TAKE ONE TABLET BY MOUTH AT BEDTIME Patient taking differently: Take 25 mg by mouth at bedtime. 11/15/13   Doris Cheadle, MD  ARNICA EX Apply 1 application topically daily as needed (for pain).    [provider]  aspirin EC 81 MG tablet Take 81 mg by mouth daily.    [provider]  diclofenac (VOLTAREN) 75 MG EC tablet Take 1 tablet (75 mg total) by mouth 2 (two) times daily. 11/15/13   Doris Cheadle, MD  EPINEPHrine (EPIPEN 2-PAK) 0.3 mg/0.3 mL IJ SOAJ injection Inject 0.3 mg into the muscle as  needed for anaphylaxis. 11/27/20   Hetty Blend, FNP  gabapentin (NEURONTIN) 300 MG capsule Take 1 capsule (300 mg total) by mouth 3 (three) times daily. Patient taking differently: Take 300 mg by mouth at bedtime. 12/17/14 10/20/18  Beers, Charmayne Sheer, PA-C  hydrochlorothiazide (HYDRODIURIL) 25 MG tablet Take 25 mg by mouth daily.    [provider]  levocetirizine (XYZAL) 5 MG tablet Take 10 mg by mouth 2 (two) times daily as needed. 11/10/20   [provider]  omeprazole (PRILOSEC) 20 MG capsule Take 20 mg by mouth daily.    [provider]  predniSONE (DELTASONE) 10 MG tablet Take 2 in the evening Take 2 at breakfast and 2 at lunch for 2 days  Take 2 at breakfast  Take 1 at breakfast 11/27/20   Ambs, Norvel Richards, FNP  tiZANidine (ZANAFLEX) 4 MG tablet TAKE 1 TABLET BY MOUTH THREE TIMES DAILY 05/26/18   [provider]  traMADol (ULTRAM) 50 MG tablet Take by mouth as needed.    [provider]  trolamine salicylate (ASPERCREME) 10 % cream Apply 1 application topically daily as needed for muscle pain.    [provider]    Family History  Problem Relation Age of Onset   Emphysema Mother    Diabetes Mother    Heart disease Mother    Arthritis Mother    Pancreatic cancer Father    Colon cancer Father    ALS Sister    Hyperlipidemia Brother    Esophageal cancer Neg Hx    Rectal cancer Neg Hx    Stomach cancer Neg Hx    Colon polyps Neg Hx    Allergic rhinitis Neg Hx    Angioedema Neg Hx    Asthma Neg Hx    Eczema Neg Hx    Immunodeficiency Neg Hx    Urticaria Neg Hx      Social History   Tobacco Use   Smoking status: Former    Packs/day: 0.25    Types: Cigarettes    Quit date: 08/10/2019    Years since quitting: 1.9   Smokeless tobacco: Never  Vaping Use   Vaping Use: Former  Substance Use Topics   Alcohol use: Not Currently   Drug use: No    Allergies as of 07/08/2021 - Review Complete 11/27/2020  Allergen Reaction Noted    Alpha-gal Anaphylaxis 03/16/2018   Phenylpropanolamine hcl Swelling    Guaifenesin Anxiety and Other (See Comments) 03/26/2015   Cyclobenzaprine hcl Other (See Comments)    Zyrtec [cetirizine] Palpitations 09/23/2020    Review of Systems:    All systems reviewed and negative except where noted in HPI.   Physical Exam:  There were no  vitals taken for this visit. No LMP recorded. Patient is postmenopausal. General:   Alert,  Well-developed, well-nourished, pleasant and cooperative in NAD Head:  Normocephalic and atraumatic. Eyes:  Sclera clear, no icterus.   Conjunctiva pink. Ears:  Normal auditory acuity. Neck:  Supple; no masses or thyromegaly. Lungs:  Respirations even and unlabored.  Clear throughout to auscultation.   No wheezes, crackles, or rhonchi. No acute distress. Heart:  Regular rate and rhythm; no murmurs, clicks, rubs, or gallops. Abdomen:  Normal bowel sounds.  No bruits.  Soft, non-tender and non-distended without masses, hepatosplenomegaly or hernias noted.  No guarding or rebound tenderness.  Negative Carnett sign.   Rectal:  Deferred.  Pulses:  Normal pulses noted. Extremities:  No clubbing or edema.  No cyanosis. Neurologic:  Alert and oriented x3;  grossly normal neurologically. Skin:  Intact without significant lesions or rashes.  No jaundice. Lymph Nodes:  No significant cervical adenopathy. Psych:  Alert and cooperative. Normal mood and affect.  Imaging Studies: No results found.  Assessment and Plan:   Kathy Schaefer is a 61 y.o. y/o female Who comes in today with a history of diverticulosis gastroparesis with nausea.  She reports she has some days worse than others.  Otherwise the patient states that she is doing well at the present time and is here for establishing care with me today is her gastrologist.  The patient is not due for colonoscopy at this time and was reported to be need a colonoscopy in 2035.  The patient has been explained the plan and agrees  with it.  The patient will follow up as needed.    Midge Minium, MD. Clementeen Graham    Note: This dictation was prepared with Dragon dictation along with smaller phrase technology. Any transcriptional errors that result from this process are unintentional.

## 2021-09-21 ENCOUNTER — Telehealth: Payer: Self-pay

## 2021-09-21 MED ORDER — METRONIDAZOLE 500 MG PO TABS: 500.0000 mg | ORAL_TABLET | Freq: Three times a day (TID) | ORAL | 0 refills | Status: DC

## 2021-09-21 MED ORDER — CIPROFLOXACIN HCL 500 MG PO TABS: 500.0000 mg | ORAL_TABLET | Freq: Two times a day (BID) | ORAL | 0 refills | Status: DC

## 2021-09-21 NOTE — Telephone Encounter (Signed)
Patient states her symptoms started today and she is having LLQ pain, nausea, and hard stools before today. Patient had diarrhea before the hard stools. Denies any blood in stool

## 2021-09-21 NOTE — Telephone Encounter (Signed)
I spoke to pt and she states that the treatment from 05/14/2021 did resolve her Sx... After reviewing care everywhere the following Rx were listed  Ordered Prescriptions  Prescription Sig Dispensed Refills Start Date End Date  metroNIDAZOLE (FLAGYL) 500 MG tablet   Take 1 tablet (500 mg total) by mouth every 8 (eight) hours for 7 days 21 tablet   0 05/14/2021 05/21/2021  ciprofloxacin HCl (CIPRO) 500 MG tablet   Take 1 tablet (500 mg total) by mouth 2 (two) times daily for 7 days 14 tablet   0 05/14/2021 05/21/2021    Per verbal order from Dr Servando Snare, send in previous treatment if pt reports the medication did resolve her Sx....  Rx sent through e-scribe

## 2021-09-25 DIAGNOSIS — M8589 Other specified disorders of bone density and structure, multiple sites: Secondary | ICD-10-CM | POA: Insufficient documentation

## 2021-09-29 ENCOUNTER — Other Ambulatory Visit: Payer: Self-pay | Admitting: *Deleted

## 2021-09-29 MED ORDER — OMALIZUMAB 150 MG/ML ~~LOC~~ SOSY: 300.0000 mg | PREFILLED_SYRINGE | SUBCUTANEOUS | 11 refills | Status: DC

## 2021-11-03 ENCOUNTER — Other Ambulatory Visit: Payer: Self-pay

## 2021-11-03 ENCOUNTER — Ambulatory Visit (INDEPENDENT_AMBULATORY_CARE_PROVIDER_SITE_OTHER): Payer: Medicare Other | Admitting: Allergy & Immunology

## 2021-11-03 ENCOUNTER — Encounter: Payer: Self-pay | Admitting: Allergy & Immunology

## 2021-11-03 VITALS — BP 130/90 | HR 87 | Temp 98.1°F | Resp 12 | Ht 66.0 in | Wt 252.4 lb

## 2021-11-03 DIAGNOSIS — J3089 Other allergic rhinitis: Secondary | ICD-10-CM

## 2021-11-03 DIAGNOSIS — J302 Other seasonal allergic rhinitis: Secondary | ICD-10-CM

## 2021-11-03 DIAGNOSIS — T783XXD Angioneurotic edema, subsequent encounter: Secondary | ICD-10-CM

## 2021-11-03 NOTE — Progress Notes (Signed)
Noted will make note on snapshot for new dosing ?

## 2021-11-03 NOTE — Progress Notes (Signed)
? ?FOLLOW UP ? ?Date of Service/Encounter:  11/03/21 ? ? ?Assessment:  ? 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 ?Allergic rhinoconjunctivitis ? ?Gal allergy ? ? ?Plan/Recommendations:  ? ?1. Angioedema with urticaria ?- Let's wean the injections to every 6 weeks and see if this works fine. ?- We will wean further at the next visit. ?- Call us with breakthrough symptoms and questions. ?- Let's see if this works.  ? ?2. Seasonal and perennial allergic rhinitis ?- It seems that your symptoms are under good control. ?- You do not seem to have much in the way of symptoms. ? ?3. Return in about 6 months (around 05/06/2022).  ? ? ? ?Subjective:  ? ?Kathy Schaefer is a 61 y.o. female presenting today for follow up of  ?Chief Complaint  ?Patient presents with  ? Follow-up  ?  No issues  ? ? ?Kathy Schaefer has a history of the following: ?Patient Active Problem List  ? Diagnosis Date Noted  ? Low back pain 07/08/2021  ? Anemia 05/20/2021  ? Ectatic abdominal aorta ([place]) 04/16/2021  ? Hypertensive disorder 12/22/2020  ? Idiopathic angioedema 12/22/2020  ? Neck pain 10/25/2020  ? Irritable bowel syndrome with constipation 05/21/2020  ? Gastroparesis 03/31/2020  ? Prediabetes 06/04/2019  ? Chronic pain of right knee 12/01/2018  ? Diverticulosis 12/01/2018  ? Sacro-iliac pain 04/30/2018  ? Polyarthralgia 02/17/2018  ? Encounter for long-term use of opiate analgesic 02/17/2018  ? Chronic pain disorder 02/17/2018  ? Cervical spondylosis without myelopathy 02/17/2018  ? Arthropathy of cervical facet joint 02/17/2018  ? Tobacco use disorder 02/03/2018  ? History of stroke 02/03/2018  ? Spondylosis of lumbar spine 01/04/2018  ? Pain of right heel 01/04/2018  ? DDD (degenerative disc disease), cervical 01/04/2018  ? Generalized OA 01/04/2018  ? Familial hypercholesterolemia 01/04/2018  ? Bone spur of foot 11/07/2017  ? Torn Achilles tendon 11/07/2017  ? High triglycerides 11/16/2013  ?  Intervertebral disc protrusion 11/16/2013  ? Osteoarthritis 03/27/2013  ? Depression (emotion) 03/27/2013  ? GERD 06/13/2007  ? PLANTAR FASCIITIS 06/13/2007  ? ? ?History obtained from: chart review and patient. ? ?Kathy Schaefer is a 61 y.o. female presenting for a follow up visit.  She was last seen via televisit in April 2022.  At that time, she was actually diagnosed with a sinus infection and started on prednisone as well as Flonase.  For her allergic rhinitis, she was started on the Flonase.  Her Xyzal was temporarily stopped.  For the alpha gal, we recommended continued avoidance of mammalian meats.  Her hives and angioedema were much better controlled with the Xolair. ? ?Since last visit, she has done very well.  She is now living in [place], which she is not happy about.  She apparently is living in her brother's home.  Her brother offered it to her.  Her brother mostly stays with his girlfriend, she is just paying utilities. She does miss [place]. ? ?Allergic Rhinitis Symptom History: Despite having all the allergic sensitizations, she does not have much in the way of postnasal drip, sneezing or runny nose.  She has not needed antibiotics at all since last time I saw her. ? ?Skin Symptom History: She has not had any outbreaks since starting the Xolair. She was having them every other day prior to starting the [place]. She is doing much better.  She knows that it was life changing but she also does not like to take  medications.  She has been completely asymptomatic on the Xolair. ? ?She is still avoiding red meats.  She has been doing this for probably 6 years, well before I met her.  She does not really miss it at all.  She denies any cross contamination episodes. ? ?Her parakeets are named Gilberto Better and Rodney Langton are doing well.  They have more room to fly around when she lives in [place], however.  Apparently they have their own room at her current house.  He was worried about droppings, but [place] assures me  that they only poop where they are allowed to purchase.  Says she can selectively put newspaper down under spots where they can purchase and this is caught all of the people. ? ?Otherwise, there have been no changes to her past medical history, surgical history, family history, or social history. ? ? ? ?Review of Systems  ?Constitutional: Negative.  Negative for chills, fever, malaise/fatigue and weight loss.  ?HENT: Negative.  Negative for congestion, ear discharge and ear pain.   ?Eyes:  Negative for pain, discharge and redness.  ?Respiratory:  Negative for cough, sputum production, shortness of breath and wheezing.   ?Cardiovascular: Negative.  Negative for chest pain and palpitations.  ?Gastrointestinal:  Negative for abdominal pain, constipation, diarrhea, heartburn, nausea and vomiting.  ?Skin: Negative.  Negative for itching and rash.  ?Neurological:  Negative for dizziness and headaches.  ?Endo/Heme/Allergies:  Negative for environmental allergies. Does not bruise/bleed easily.   ? ? ? ?Objective:  ? ?Blood pressure 130/90, pulse 87, temperature 98.1 ?F (36.7 ?C), temperature source Temporal, resp. rate 12, height 5' 6" (1.676 m), weight 252 lb 6.4 oz (114.5 kg), SpO2 97 %. ?Body mass index is 40.74 kg/m?. ? ? ? ?Physical Exam ?Vitals reviewed.  ?Constitutional:   ?   Appearance: She is well-developed.  ?   Comments: Very pleasant.  ?HENT:  ?   Head: Normocephalic and atraumatic.  ?   Right Ear: Tympanic membrane, ear canal and external ear normal. No drainage, swelling or tenderness. Tympanic membrane is not injected, scarred, erythematous, retracted or bulging.  ?   Left Ear: Tympanic membrane, ear canal and external ear normal. No drainage, swelling or tenderness. Tympanic membrane is not injected, scarred, erythematous, retracted or bulging.  ?   Nose: No nasal deformity, septal deviation, mucosal edema or rhinorrhea.  ?   Right Turbinates: Enlarged, swollen and pale.  ?   Left Turbinates: Enlarged,  swollen and pale.  ?   Right Sinus: No maxillary sinus tenderness or frontal sinus tenderness.  ?   Left Sinus: No maxillary sinus tenderness or frontal sinus tenderness.  ?   Mouth/Throat:  ?   Mouth: Mucous membranes are not pale and not dry.  ?   Pharynx: Uvula midline.  ?Eyes:  ?   General:     ?   Right eye: No discharge.     ?   Left eye: No discharge.  ?   Conjunctiva/sclera: Conjunctivae normal.  ?   Right eye: Right conjunctiva is not injected. No chemosis. ?   Left eye: Left conjunctiva is not injected. No chemosis. ?   Pupils: Pupils are equal, round, and reactive to light.  ?Cardiovascular:  ?   Rate and Rhythm: Normal rate and regular rhythm.  ?   Heart sounds: Normal heart sounds.  ?Pulmonary:  ?   Effort: Pulmonary effort is normal. No tachypnea, accessory muscle usage or respiratory distress.  ?   Breath sounds: Normal breath  sounds. No wheezing, rhonchi or rales.  ?   Comments: Moving air well in all lung fields.  No increased work of breathing. ?Chest:  ?   Chest wall: No tenderness.  ?Lymphadenopathy:  ?   Head:  ?   Right side of head: No submandibular, tonsillar or occipital adenopathy.  ?   Left side of head: No submandibular, tonsillar or occipital adenopathy.  ?   Cervical: No cervical adenopathy.  ?Skin: ?   General: Skin is warm.  ?   Capillary Refill: Capillary refill takes less than 2 seconds.  ?   Coloration: Skin is not pale.  ?   Findings: No abrasion, erythema, petechiae or rash. Rash is not papular, urticarial or vesicular.  ?   Comments: Resolving urticarial lesion on the right shoulder.  No angioedema appreciated today.  ?Neurological:  ?   Mental Status: She is alert.  ?Psychiatric:     ?   Behavior: Behavior is cooperative.  ?  ? ?Diagnostic studies: none ? ? ? ? ?  ?Salvatore Marvel, MD  ?[facility] ? ? ? ? ? ? ?

## 2021-11-03 NOTE — Patient Instructions (Addendum)
1. Angioedema with urticaria ?- Let's wean the injections to every 6 weeks and see if this works fine. ?- We will wean further at the next visit. ?- Call us with breakthrough symptoms and questions. ?- Let's see if this works.  ? ?2. Seasonal and perennial allergic rhinitis ?- It seems that your symptoms are under good control. ?- You do not seem to have much in the way of symptoms. ? ?3. Return in about 6 months (around 05/06/2022).  ? ? ?Please inform us of any Emergency Department visits, hospitalizations, or changes in symptoms. Call us before going to the ED for breathing or allergy symptoms since we might be able to fit you in for a sick visit. Feel free to contact us anytime with any questions, problems, or concerns. ? ?It was a pleasure to see you again today! ? ?Websites that have reliable patient information: ?1. American Academy of Asthma, Allergy, and Immunology: www.aaaai.org ?2. Food Allergy Research and Education (FARE): foodallergy.org ?3. Mothers of Asthmatics: http://www.asthmacommunitynetwork.org ?4. [employer] of Allergy, Asthma, and Immunology: MonthlyElectricBill.co.uk ? ? ?COVID-19 Vaccine Information can be found at: ShippingScam.co.uk For questions related to vaccine distribution or appointments, please email [email or web address] or call [phone].  ? ?We realize that you might be concerned about having an allergic reaction to the COVID19 vaccines. To help with that concern, WE ARE OFFERING THE COVID19 VACCINES IN OUR OFFICE! Ask the front desk for dates!  ? ? ? ??Like? [place] on Facebook and Instagram for our latest updates!  ?  ? ? ?A healthy democracy works best when [employer] participate! Make sure you are registered to vote! If you have moved or changed any of your contact information, you will need to get this updated before voting! ? ?In some cases, you MAY be able to register to vote online:  [email or web address] ? ? ? ? ? ? ? ? ? ? ?

## 2021-12-26 DIAGNOSIS — M5430 Sciatica, unspecified side: Secondary | ICD-10-CM | POA: Insufficient documentation

## 2022-10-12 ENCOUNTER — Other Ambulatory Visit: Payer: Self-pay | Admitting: *Deleted

## 2022-10-12 MED ORDER — OMALIZUMAB 150 MG/ML ~~LOC~~ SOSY: 300.0000 mg | PREFILLED_SYRINGE | SUBCUTANEOUS | 11 refills | Status: DC

## 2022-12-16 ENCOUNTER — Encounter: Payer: Self-pay | Admitting: Allergy & Immunology

## 2022-12-16 ENCOUNTER — Other Ambulatory Visit: Payer: Self-pay

## 2022-12-16 ENCOUNTER — Ambulatory Visit (INDEPENDENT_AMBULATORY_CARE_PROVIDER_SITE_OTHER): Payer: Medicare Other | Admitting: Allergy & Immunology

## 2022-12-16 VITALS — Wt 210.0 lb

## 2022-12-16 DIAGNOSIS — J302 Other seasonal allergic rhinitis: Secondary | ICD-10-CM

## 2022-12-16 DIAGNOSIS — J3089 Other allergic rhinitis: Secondary | ICD-10-CM | POA: Diagnosis not present

## 2022-12-16 DIAGNOSIS — T783XXD Angioneurotic edema, subsequent encounter: Secondary | ICD-10-CM

## 2022-12-16 DIAGNOSIS — Z91014 Allergy to mammalian meats: Secondary | ICD-10-CM

## 2022-12-16 DIAGNOSIS — L508 Other urticaria: Secondary | ICD-10-CM | POA: Diagnosis not present

## 2022-12-16 NOTE — Progress Notes (Signed)
RE: Kathy Schaefer MRN: [id number] DOB: [birth year] Date of Telemedicine Visit: 12/16/2022  Referring provider: No ref. provider found Primary care provider: Blanch Media, MD  Chief Complaint: Angioedema (No issues ) and Allergic Reaction (Still avoiding red meats)   Telemedicine Follow Up Visit via Telephone: I connected with Kathy Schaefer for a follow up on 12/16/22 by telephone and verified that I am speaking with the correct person using two identifiers.   I discussed the limitations, risks, security and privacy concerns of performing an evaluation and management service by telephone and the availability of in person appointments. I also discussed with the patient that there may be a patient responsible charge related to this service. The patient expressed understanding and agreed to proceed.  Patient is at home.  Provider is at the office.  Visit start time: 12:05 PM Visit end time: 12:25 PM Insurance consent/check in by: Dr. Reece Agar Medical consent and medical assistant/nurse: Dr. Reece Agar  History of Present Illness:  She is a 62 y.o. female, who is being followed for angioedema as well as urticaria and alpha gal syndrome. Her previous allergy office visit was in March 2023 with myself. She was last seen in March 2023.   At that time, we weaned injections of her Xolair to every 6 weeks.   Since last visit, she has remained on her Xolair.  She has been doing well on her Xolair. She did try spacing out to every 6 weeks, but after one week of being late for the Xolair, she started feeling the tingling and the tightness in her throat. She did not feel good and is now back to every month. She is giving it to herself at home without a problem.   This pays a small amount for the first several injections with hert insurance. For the first few, she pays a small amount and then it drops. She does not use any antihistamines above and beyond the Xolair,. They never really worked anyway, so she  just dropped them.   Food Allergy Symptom History: She is avoidiung red meats.  She is looking for a sausage gravy biscuit which she is going to get when she goes out for brunch on Sunday for Mother's Day.  This is going to be an alternative meat.  She is excited about it!  She has a lot of physical pain and has been working through this. She is going to see Dr. Darrold Junker with [facility].  She is optimistic that he will be able to provide a lot of relief for her symptoms.  Otherwise, there have been no changes to her past medical history, surgical history, family history, or social history.  Assessment and Plan:  Kathy Schaefer is a 62 y.o. 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 Angioedema with urticaria - Let's wean the injections to every 6 weeks and see if this works fine. - We will wean further at the next visit. - Call us with breakthrough symptoms and questions. - Let's see if this works.   2. Seasonal and perennial allergic rhinitis - It seems that your symptoms are under good control. - You do not seem to have much in the way of symptoms.  3. Follow up in one year or earlier if needed.    Diagnostics: None.  Medication List:  Current Outpatient Medications  Medication Sig Dispense Refill   amitriptyline (ELAVIL)  25 MG tablet TAKE ONE TABLET BY MOUTH AT BEDTIME (Patient taking differently: Take 25 mg by mouth at bedtime.) 30 tablet 3   ARNICA EX Apply 1 application topically daily as needed (for pain).     aspirin EC 81 MG tablet Take 81 mg by mouth daily.     atorvastatin (LIPITOR) 10 MG tablet Take by mouth.     CALCIUM PO Take by mouth daily.     Cholecalciferol (VITAMIN D3 PO) Take by mouth daily.     diclofenac (VOLTAREN) 75 MG EC tablet Take 1 tablet (75 mg total) by mouth 2 (two) times daily. 60 tablet 3   EPINEPHrine (EPIPEN 2-PAK) 0.3 mg/0.3 mL IJ SOAJ  injection Inject 0.3 mg into the muscle as needed for anaphylaxis. 1 each 2   hydrochlorothiazide (HYDRODIURIL) 25 MG tablet Take 25 mg by mouth daily.     HYDROcodone-acetaminophen (NORCO/VICODIN) 5-325 MG tablet Take by mouth.     omalizumab Geoffry Paradise) 150 MG/ML prefilled syringe Inject 300 mg into the skin every 28 (twenty-eight) days. 2 mL 11   omeprazole (PRILOSEC) 20 MG capsule Take 20 mg by mouth daily.     pregabalin (LYRICA) 50 MG capsule Take 50 mg by mouth.     tiZANidine (ZANAFLEX) 4 MG tablet TAKE 1 TABLET BY MOUTH THREE TIMES DAILY     trolamine salicylate (ASPERCREME) 10 % cream Apply 1 application topically daily as needed for muscle pain.     ciprofloxacin (CIPRO) 500 MG tablet Take 1 tablet (500 mg total) by mouth 2 (two) times daily. (Patient not taking: Reported on 11/03/2021) 14 tablet 0   gabapentin (NEURONTIN) 300 MG capsule Take 1 capsule (300 mg total) by mouth 3 (three) times daily. (Patient taking differently: Take 300 mg by mouth at bedtime.) 90 capsule 0   levocetirizine (XYZAL) 5 MG tablet Take 10 mg by mouth 2 (two) times daily as needed.     metroNIDAZOLE (FLAGYL) 500 MG tablet Take 1 tablet (500 mg total) by mouth 3 (three) times daily. (Patient not taking: Reported on 11/03/2021) 21 tablet 0   predniSONE (DELTASONE) 10 MG tablet Take 2 in the evening Take 2 at breakfast and 2 at lunch for 2 days  Take 2 at breakfast  Take 1 at breakfast (Patient not taking: Reported on 11/03/2021) 13 tablet 0   traMADol (ULTRAM) 50 MG tablet Take by mouth as needed.     Current Facility-Administered Medications  Medication Dose Route Frequency Provider Last Rate Last Admin   omalizumab Geoffry Paradise) injection 300 mg  300 mg Subcutaneous Q28 days Marcelyn Bruins, MD   300 mg at 01/30/21 1347   Allergies: Allergies  Allergen Reactions   Alpha-Gal Anaphylaxis    BEEF, LAMB, PORK   Phenylpropanolamine Hcl Swelling    SWELLING REACTION UNSPECIFIED    Cephalexin Hives,  Itching, Rash and Swelling    Hives and swelling    Guaifenesin Anxiety and Other (See Comments)    Every nerve in body was on edge  Unable to describe per patient   Cyclobenzaprine Hcl Other (See Comments)    Headache   Zyrtec [Cetirizine] Palpitations   I reviewed her past medical history, social history, family history, and environmental history and no significant changes have been reported from previous visits.  Review of Systems  Constitutional: Negative.  Negative for fever.  HENT: Negative.  Negative for congestion, ear discharge and ear pain.   Eyes:  Negative for pain, discharge and redness.  Respiratory:  Negative for  cough, shortness of breath and wheezing.   Cardiovascular: Negative.  Negative for chest pain and palpitations.  Gastrointestinal:  Negative for abdominal pain.  Skin: Negative.  Negative for rash.  Allergic/Immunologic: Negative for environmental allergies.  Neurological:  Negative for dizziness and headaches.  Hematological:  Does not bruise/bleed easily.    Objective:  Physical exam not obtained as encounter was done via telephone.   Previous notes and tests were reviewed.  I discussed the assessment and treatment plan with the patient. The patient was provided an opportunity to ask questions and all were answered. The patient agreed with the plan and demonstrated an understanding of the instructions.   The patient was advised to call back or seek an in-person evaluation if the symptoms worsen or if the condition fails to improve as anticipated.  I provided 25 minutes of non-face-to-face time during this encounter.  It was my pleasure to participate in Kathy Schaefer care today. Please feel free to contact me with any questions or concerns.   Sincerely,  Alfonse Spruce, MD

## 2022-12-16 NOTE — Patient Instructions (Addendum)
1. Angioedema with urticaria - Let's wean the injections to every 6 weeks and see if this works fine. - We will wean further at the next visit. - Call us with breakthrough symptoms and questions. - Let's see if this works.   2. Seasonal and perennial allergic rhinitis - It seems that your symptoms are under good control. - You do not seem to have much in the way of symptoms.  3. Follow up in one year or earlier if needed.     Please inform us of any Emergency Department visits, hospitalizations, or changes in symptoms. Call us before going to the ED for breathing or allergy symptoms since we might be able to fit you in for a sick visit. Feel free to contact us anytime with any questions, problems, or concerns.  It was a pleasure to see you again today!  Websites that have reliable patient information: 1. American Academy of Asthma, Allergy, and Immunology: www.aaaai.org 2. Food Allergy Research and Education (FARE): foodallergy.org 3. Mothers of Asthmatics: http://www.asthmacommunitynetwork.org 4. American College of Allergy, Asthma, and Immunology: www.acaai.org   COVID-19 Vaccine Information can be found at: PodExchange.nl For questions related to vaccine distribution or appointments, please email [email or web address] or call [phone].   We realize that you might be concerned about having an allergic reaction to the COVID19 vaccines. To help with that concern, WE ARE OFFERING THE COVID19 VACCINES IN OUR OFFICE! Ask the front desk for dates!     "Like" [place] on Facebook and Instagram for our latest updates!      A healthy democracy works best when [employer] participate! Make sure you are registered to vote! If you have moved or changed any of your contact information, you will need to get this updated before voting!  In some cases, you MAY be able to register to vote online:  [email or web address]

## 2023-02-16 ENCOUNTER — Telehealth: Payer: Self-pay | Admitting: Allergy & Immunology

## 2023-02-16 NOTE — Telephone Encounter (Signed)
Patient called and stated she would like a call back from Dr Dellis Anes or his nurse. Patient states that she had blood work done by pain management doctor and blood work is showing something regarding Lymes Disease. Patient is requesting that Dr Dellis Anes look over this blood work and please explain what she should do about it because she says that her pain management doctor has proven himself incompitent. Patients call back number is [phone]

## 2023-02-22 NOTE — Telephone Encounter (Signed)
Called and spoke to patient and informed her of the message per Dr. Dellis Anes. Patient states that she does see pain management but she is going to leave due to them not meeting her needs. Patient is currently scheduled with Nehemiah Settle as she wanted to get in sooner than later. Patient informed that NP and MD's do collaborate so further questions will be dicussed with Dr. Dellis Anes. Patient verbalized understanding.

## 2023-02-22 NOTE — Telephone Encounter (Signed)
I do not see the labs, so we might have to get a release of information (unless I am missing this, which is totally possible). She should probably make an appointment to discuss this. I did not order the labs and do not feel comfortable making any recommendations with further review.   Does she need a new pain management referral?   Malachi Bonds, MD [facility]

## 2023-02-28 NOTE — Telephone Encounter (Signed)
Thank you Dr. Gallagher

## 2023-02-28 NOTE — Telephone Encounter (Signed)
Routing to Chrissie to get this on her radar.

## 2023-03-08 ENCOUNTER — Ambulatory Visit: Payer: Medicare Other | Admitting: Family

## 2023-03-24 ENCOUNTER — Encounter: Payer: Self-pay | Admitting: Family Medicine

## 2023-03-24 ENCOUNTER — Other Ambulatory Visit: Payer: Self-pay

## 2023-03-24 ENCOUNTER — Ambulatory Visit: Payer: Medicare Other | Admitting: Family Medicine

## 2023-03-24 VITALS — BP 130/70 | HR 80 | Temp 98.1°F | Resp 16 | Wt 206.0 lb

## 2023-03-24 DIAGNOSIS — T783XXD Angioneurotic edema, subsequent encounter: Secondary | ICD-10-CM

## 2023-03-24 DIAGNOSIS — L508 Other urticaria: Secondary | ICD-10-CM | POA: Diagnosis not present

## 2023-03-24 DIAGNOSIS — R849 Unspecified abnormal finding in specimens from respiratory organs and thorax: Secondary | ICD-10-CM | POA: Diagnosis not present

## 2023-03-24 DIAGNOSIS — J3089 Other allergic rhinitis: Secondary | ICD-10-CM

## 2023-03-24 DIAGNOSIS — J302 Other seasonal allergic rhinitis: Secondary | ICD-10-CM

## 2023-03-24 DIAGNOSIS — Z91018 Allergy to other foods: Secondary | ICD-10-CM

## 2023-03-24 MED ORDER — EPINEPHRINE 0.3 MG/0.3ML IJ SOAJ: 0.3000 mg | INTRAMUSCULAR | 2 refills | Status: DC | PRN

## 2023-03-24 NOTE — Patient Instructions (Signed)
Angioedema/urticaria If your symptoms re-occur, begin a journal of events that occurred for up to 6 hours before your symptoms began including foods and beverages consumed, soaps or perfumes you had contact with, and medications.   Allergic rhinitis Continue allergen avoidance measures directed toward dust mite, cat, dog, grass pollen, weed pollen, ragweed pollen, cockroach, and mold as listed below  Call the clinic if this treatment plan is not working well for you.  Follow up in 2 weeks or sooner if needed.  Reducing Pollen Exposure The American Academy of Allergy, Asthma and Immunology suggests the following steps to reduce your exposure to pollen during allergy seasons. Do not hang sheets or clothing out to dry; pollen may collect on these items. Do not mow lawns or spend time around freshly cut grass; mowing stirs up pollen. Keep windows closed at night.  Keep car windows closed while driving. Minimize morning activities outdoors, a time when pollen counts are usually at their highest. Stay indoors as much as possible when pollen counts or humidity is high and on windy days when pollen tends to remain in the air longer. Use air conditioning when possible.  Many air conditioners have filters that trap the pollen spores. Use a HEPA room air filter to remove pollen form the indoor air you breathe.  Control of Mold Allergen Mold and fungi can grow on a variety of surfaces provided certain temperature and moisture conditions exist.  Outdoor molds grow on plants, decaying vegetation and soil.  The major outdoor mold, Alternaria and Cladosporium, are found in very high numbers during hot and dry conditions.  Generally, a late Summer - Fall peak is seen for common outdoor fungal spores.  Rain will temporarily lower outdoor mold spore count, but counts rise rapidly when the rainy period ends.  The most important indoor molds are Aspergillus and Penicillium.  Dark, humid and poorly ventilated  basements are ideal sites for mold growth.  The next most common sites of mold growth are the bathroom and the kitchen.  Outdoor [employer] Use air conditioning and keep windows closed Avoid exposure to decaying vegetation. Avoid leaf raking. Avoid grain handling. Consider wearing a face mask if working in moldy areas.  Indoor Mold Control Maintain humidity below 50%. Clean washable surfaces with 5% bleach solution. Remove sources e.g. Contaminated carpets.   Control of Dust Mite Allergen Dust mites play a major role in allergic asthma and rhinitis. They occur in environments with high humidity wherever human skin is found. Dust mites absorb humidity from the atmosphere (ie, they do not drink) and feed on organic matter (including shed human and animal skin). Dust mites are a microscopic type of insect that you cannot see with the naked eye. High levels of dust mites have been detected from mattresses, pillows, carpets, upholstered furniture, bed covers, clothes, soft toys and any woven material. The principal allergen of the dust mite is found in its feces. A gram of dust may contain 1,000 mites and 250,000 fecal particles. Mite antigen is easily measured in the air during house cleaning activities. Dust mites do not bite and do not cause harm to humans, other than by triggering allergies/asthma.  Ways to decrease your exposure to dust mites in your home:  1. Encase mattresses, box springs and pillows with a mite-impermeable barrier or cover  2. Wash sheets, blankets and drapes weekly in hot water (130 F) with detergent and dry them in a dryer on the hot setting.  3. Have the room cleaned  frequently with a vacuum cleaner and a damp dust-mop. For carpeting or rugs, vacuuming with a vacuum cleaner equipped with a high-efficiency particulate air (HEPA) filter. The dust mite allergic individual should not be in a room which is being cleaned and should wait 1 hour after cleaning before going  into the room.  4. Do not sleep on upholstered furniture (eg, couches).  5. If possible removing carpeting, upholstered furniture and drapery from the home is ideal. Horizontal blinds should be eliminated in the rooms where the person spends the most time (bedroom, study, television room). Washable vinyl, roller-type shades are optimal.  6. Remove all non-washable stuffed toys from the bedroom. Wash stuffed toys weekly like sheets and blankets above.  7. Reduce indoor humidity to less than 50%. Inexpensive humidity monitors can be purchased at most hardware stores. Do not use a humidifier as can make the problem worse and are not recommended.  11/20/59  Control of Cockroach Allergen Cockroach allergen has been identified as an important cause of acute attacks of asthma, especially in urban settings.  There are fifty-five species of cockroach that exist in the [place], however only three, the [place], [place] species produce allergen that can affect patients with Asthma.  Allergens can be obtained from fecal particles, egg casings and secretions from cockroaches.    Remove food sources. Reduce access to water. Seal access and entry points. Spray runways with 0.5-1% Diazinon or Chlorpyrifos Blow boric acid power under stoves and refrigerator. Place bait stations (hydramethylnon) at feeding sites.

## 2023-03-24 NOTE — Progress Notes (Unsigned)
   [address] [place] [address] Dept: [phone]  FOLLOW UP NOTE  Patient ID: Kathy Schaefer, female    DOB: 1961-04-01  Age: 62 y.o. MRN: [id number] Date of Office Visit: 03/24/2023  Assessment  Chief Complaint: No chief complaint on file.  HPI Kathy Schaefer is a 62 year old female who presents to the clinic for follow-up visit.  She was last seen in this clinic on 12/16/2022 by Dr. Dellis Anes via telephone for evaluation of angioedema/urticaria and allergic rhinitis.  In the interim, she reports that she visited her pain doctor in March at which time she had a lab trest   At today's visit, her last environmental allergie lab testing was on 09/23/2020 and was positive to dust mite, cat, dog, grass pollen, cockroach, mold, weed pollen, and ragweed pollen.   Drug Allergies:  Allergies  Allergen Reactions   Alpha-Gal Anaphylaxis    BEEF, LAMB, PORK   Phenylpropanolamine Hcl Swelling    SWELLING REACTION UNSPECIFIED    Cephalexin Hives, Itching, Rash and Swelling    Hives and swelling    Guaifenesin Anxiety and Other (See Comments)    Every nerve in body was on edge  Unable to describe per patient   Cyclobenzaprine Hcl Other (See Comments)    Headache   Zyrtec [Cetirizine] Palpitations    Physical Exam: There were no vitals taken for this visit.   Physical Exam  Diagnostics:    Assessment and Plan: No diagnosis found.  No orders of the defined types were placed in this encounter.   There are no Patient Instructions on file for this visit.  No follow-ups on file.    Thank you for the opportunity to care for this patient.  Please do not hesitate to contact me with questions.  Thermon Leyland, FNP [facility]

## 2023-03-26 ENCOUNTER — Encounter: Payer: Self-pay | Admitting: Family Medicine

## 2023-03-26 DIAGNOSIS — J302 Other seasonal allergic rhinitis: Secondary | ICD-10-CM | POA: Insufficient documentation

## 2023-03-26 DIAGNOSIS — L508 Other urticaria: Secondary | ICD-10-CM

## 2023-03-26 DIAGNOSIS — R849 Unspecified abnormal finding in specimens from respiratory organs and thorax: Secondary | ICD-10-CM | POA: Insufficient documentation

## 2023-03-26 HISTORY — DX: Other urticaria: L50.8

## 2023-07-13 ENCOUNTER — Other Ambulatory Visit: Payer: Self-pay | Admitting: Internal Medicine

## 2023-07-13 DIAGNOSIS — I714 Abdominal aortic aneurysm, without rupture, unspecified: Secondary | ICD-10-CM

## 2023-07-19 ENCOUNTER — Ambulatory Visit
Admission: RE | Admit: 2023-07-19 | Discharge: 2023-07-19 | Disposition: A | Discharge status: Home / Self Care | Payer: Medicare Other | Source: Ambulatory Visit | Attending: Internal Medicine | Admitting: Internal Medicine

## 2023-07-19 DIAGNOSIS — I714 Abdominal aortic aneurysm, without rupture, unspecified: Secondary | ICD-10-CM

## 2023-08-17 DIAGNOSIS — Z91018 Allergy to other foods: Secondary | ICD-10-CM | POA: Insufficient documentation

## 2023-08-23 ENCOUNTER — Encounter: Payer: Self-pay | Admitting: Emergency Medicine

## 2023-08-23 ENCOUNTER — Ambulatory Visit
Admission: EM | Admit: 2023-08-23 | Discharge: 2023-08-23 | Disposition: A | Discharge status: Home / Self Care | Payer: Medicare Other | Attending: Internal Medicine | Admitting: Internal Medicine

## 2023-08-23 DIAGNOSIS — J0110 Acute frontal sinusitis, unspecified: Secondary | ICD-10-CM | POA: Diagnosis not present

## 2023-08-23 MED ORDER — BENZONATATE 100 MG PO CAPS: 100.0000 mg | ORAL_CAPSULE | Freq: Three times a day (TID) | ORAL | 0 refills | Status: DC

## 2023-08-23 MED ORDER — AMOXICILLIN-POT CLAVULANATE 875-125 MG PO TABS: 1.0000 | ORAL_TABLET | Freq: Two times a day (BID) | ORAL | 0 refills | Status: DC

## 2023-08-23 MED ORDER — BENZONATATE 100 MG PO CAPS
100.0000 mg | ORAL_CAPSULE | Freq: Three times a day (TID) | ORAL | 0 refills | Status: DC
Start: 2023-08-23 — End: 2023-09-07

## 2023-08-23 NOTE — ED Provider Notes (Addendum)
 [facility]    CSN: [id number] Arrival date & time: 08/23/23  0955      History   Chief Complaint Chief Complaint  Patient presents with   Cough    HPI Kathy Schaefer is a 63 y.o. female.   Kathy Schaefer is a 63 y.o. female presenting for chief complaint of Cough, nasal congestion, generalized fatigue that started 7 days ago on August 16, 2023. Cough is sometimes productive and sometimes dry. Nasal congestion is thick and yellow/green. Denies history of chronic respiratory problems. Former smoker. Denies shortness of breath, chest pain, dizziness, nausea, vomiting, diarrhea, abdominal pain, rash, and fevers/chills. No recent sick contacts with similar symptoms. Taking OTC medications without much relief. No recent antibiotic/steroid use reported.    Cough   Past Medical History:  Diagnosis Date   Abdominal aortic aneurysm (AAA) (HCC)    ectatic abdominal aorta 2016   Allergy to alpha-gal    Angio-edema    Chronic urticaria 03/26/2023   DDD (degenerative disc disease), lumbar    and cervical per pt   Degenerative disc disease    Depression    pt denies   GERD (gastroesophageal reflux disease)    Head ache    History of hiatal hernia    years ago   Hyperlipidemia    Hypoglycemia    when she doesnt eat or have protein   Neuropathy    Stroke (HCC) 2018   TIA    Patient Active Problem List   Diagnosis Date Noted   Seasonal and perennial allergic rhinitis 03/26/2023   Chronic urticaria 03/26/2023   Abnormal respiratory laboratory results 03/26/2023   Low back pain 07/08/2021   Anemia 05/20/2021   Ectatic abdominal aorta (HCC) 04/16/2021   Hypertensive disorder 12/22/2020   Angio-edema 12/22/2020   Neck pain 10/25/2020   Irritable bowel syndrome with constipation 05/21/2020   Gastroparesis 03/31/2020   Prediabetes 06/04/2019   Chronic pain of right knee 12/01/2018   Diverticulosis 12/01/2018   Sacro-iliac pain 04/30/2018   Polyarthralgia  02/17/2018   Encounter for long-term use of opiate analgesic 02/17/2018   Chronic pain disorder 02/17/2018   Cervical spondylosis without myelopathy 02/17/2018   Arthropathy of cervical facet joint 02/17/2018   Tobacco use disorder 02/03/2018   History of stroke 02/03/2018   Spondylosis of lumbar spine 01/04/2018   Pain of right heel 01/04/2018   DDD (degenerative disc disease), cervical 01/04/2018   Generalized OA 01/04/2018   Familial hypercholesterolemia 01/04/2018   Bone spur of foot 11/07/2017   Torn Achilles tendon 11/07/2017   High triglycerides 11/16/2013   Intervertebral disc protrusion 11/16/2013   Osteoarthritis 03/27/2013   Depression 03/27/2013   GERD 06/13/2007   PLANTAR FASCIITIS 06/13/2007    Past Surgical History:  Procedure Laterality Date   COLONOSCOPY     MOUTH SURGERY  03/17/2018   removed 8 bottom teeth   TOOTH EXTRACTION N/A 03/17/2018   Procedure: DENTAL EXTRACTIONS TEETH NUMBER TWENTY ONE, TWENTY TWO, TWENTY THREE, TWENTY FOUR, TWENTY FIVE, TWENTY SIX, TWENTY SEVEN, TWENTY EIGHT, ALVEOLOPLASTY;  Surgeon: Sheryle Hamilton, DDS;  Location: [facility] OR;  Service: Oral Surgery;  Laterality: N/A;    OB History   No obstetric history on file.      Home Medications    Prior to Admission medications   Medication Sig Start Date End Date Taking? Authorizing Provider  amitriptyline  (ELAVIL ) 25 MG tablet TAKE ONE TABLET BY MOUTH AT BEDTIME Patient taking differently: Take 25 mg by mouth at bedtime.  11/15/13   Advani, Deepak, MD  amoxicillin -clavulanate (AUGMENTIN ) 875-125 MG tablet Take 1 tablet by mouth every 12 (twelve) hours. 08/23/23   Enedelia Dorna HERO, FNP  ARNICA EX Apply 1 application topically daily as needed (for pain).    [provider]  aspirin EC 81 MG tablet Take 81 mg by mouth daily.    [provider]  atorvastatin (LIPITOR) 10 MG tablet Take by mouth. 05/27/21   [provider]  benzonatate  (TESSALON ) 100 MG capsule Take 1  capsule (100 mg total) by mouth every 8 (eight) hours. 08/23/23   Khambrel Amsden M, FNP  CALCIUM PO Take by mouth daily.    [provider]  Cholecalciferol (VITAMIN D3 PO) Take by mouth daily.    [provider]  EPINEPHrine  (EPIPEN  2-PAK) 0.3 mg/0.3 mL IJ SOAJ injection Inject 0.3 mg into the muscle as needed for anaphylaxis. 03/24/23   Cari Arlean HERO, FNP  hydrochlorothiazide (HYDRODIURIL) 25 MG tablet Take 25 mg by mouth daily.    [provider]  HYDROcodone -acetaminophen  (NORCO/VICODIN) 5-325 MG tablet Take by mouth. 05/12/22   [provider]  omalizumab  (XOLAIR ) 150 MG/ML prefilled syringe Inject 300 mg into the skin every 28 (twenty-eight) days. 10/12/22   Iva Marty Saltness, MD  omeprazole (PRILOSEC) 20 MG capsule Take 20 mg by mouth daily.    [provider]  pregabalin (LYRICA) 50 MG capsule Take 50 mg by mouth. 10/08/21   [provider]  tiZANidine (ZANAFLEX) 4 MG tablet TAKE 1 TABLET BY MOUTH THREE TIMES DAILY 05/26/18   [provider]  trolamine salicylate (ASPERCREME) 10 % cream Apply 1 application topically daily as needed for muscle pain.    [provider]    Family History Family History  Problem Relation Age of Onset   Emphysema Mother    Diabetes Mother    Heart disease Mother    Arthritis Mother    Pancreatic cancer Father    Colon cancer Father    ALS Sister    Hyperlipidemia Brother    Esophageal cancer Neg Hx    Rectal cancer Neg Hx    Stomach cancer Neg Hx    Colon polyps Neg Hx    Allergic rhinitis Neg Hx    Angioedema Neg Hx    Asthma Neg Hx    Eczema Neg Hx    Immunodeficiency Neg Hx    Urticaria Neg Hx     Social History Social History   Tobacco Use   Smoking status: Former    Current packs/day: 0.00    Types: Cigarettes    Quit date: 08/10/2019    Years since quitting: 4.0   Smokeless tobacco: Never  Vaping Use   Vaping status: Former  Substance Use Topics   Alcohol  use: Not Currently   Drug use: No     Allergies   Alpha-gal, Phenylpropanolamine hcl, Cephalexin, Guaifenesin, Cyclobenzaprine hcl, and Zyrtec [cetirizine]   Review of Systems Review of Systems  Respiratory:  Positive for cough.   Per HPI   Physical Exam Triage Vital Signs ED Triage Vitals  Encounter Vitals Group     BP 08/23/23 1020 134/86     Systolic BP Percentile --      Diastolic BP Percentile --      Pulse Rate 08/23/23 1020 69     Resp 08/23/23 1020 18     Temp 08/23/23 1020 98.6 F (37 C)     Temp Source 08/23/23 1020 Oral  SpO2 08/23/23 1020 95 %     Weight --      Height --      Head Circumference --      Peak Flow --      Pain Score 08/23/23 1040 0     Pain Loc --      Pain Education --      Exclude from Growth Chart --    No data found.  Updated Vital Signs BP 134/86 (BP Location: Right Arm)   Pulse 69   Temp 98.6 F (37 C) (Oral)   Resp 18   SpO2 95%   Visual Acuity Right Eye Distance:   Left Eye Distance:   Bilateral Distance:    Right Eye Near:   Left Eye Near:    Bilateral Near:     Physical Exam Vitals and nursing note reviewed.  Constitutional:      Appearance: She is not ill-appearing or toxic-appearing.  HENT:     Head: Normocephalic and atraumatic.     Right Ear: Hearing, tympanic membrane, ear canal and external ear normal.     Left Ear: Hearing, tympanic membrane, ear canal and external ear normal.     Nose: Congestion present.     Right Sinus: Frontal sinus tenderness present.     Left Sinus: Frontal sinus tenderness present.     Mouth/Throat:     Lips: Pink.     Mouth: Mucous membranes are moist. No injury or oral lesions.     Dentition: Normal dentition.     Tongue: No lesions.     Pharynx: Oropharynx is clear. Uvula midline. No pharyngeal swelling, oropharyngeal exudate, posterior oropharyngeal erythema, uvula swelling or postnasal drip.     Tonsils: No tonsillar exudate.  Eyes:     General: Lids are normal.  Vision grossly intact. Gaze aligned appropriately.     Extraocular Movements: Extraocular movements intact.     Conjunctiva/sclera: Conjunctivae normal.  Neck:     Trachea: Trachea and phonation normal.  Cardiovascular:     Rate and Rhythm: Normal rate and regular rhythm.     Heart sounds: Normal heart sounds, S1 normal and S2 normal.  Pulmonary:     Effort: Pulmonary effort is normal. No respiratory distress.     Breath sounds: Normal breath sounds and air entry. No wheezing, rhonchi or rales.  Chest:     Chest wall: No tenderness.  Musculoskeletal:     Cervical back: Neck supple.     Right lower leg: No edema.     Left lower leg: No edema.  Lymphadenopathy:     Cervical: Cervical adenopathy present.  Skin:    General: Skin is warm and dry.     Capillary Refill: Capillary refill takes less than 2 seconds.     Findings: No rash.  Neurological:     General: No focal deficit present.     Mental Status: She is alert and oriented to person, place, and time. Mental status is at baseline.     Cranial Nerves: No dysarthria or facial asymmetry.  Psychiatric:        Mood and Affect: Mood normal.        Speech: Speech normal.        Behavior: Behavior normal.        Thought Content: Thought content normal.        Judgment: Judgment normal.      UC Treatments / Results  Labs (all labs ordered are listed, but only abnormal results  are displayed) Labs Reviewed - No data to display  EKG   Radiology No results found.  Procedures Procedures (including critical care time)  Medications Ordered in UC Medications - No data to display  Initial Impression / Assessment and Plan / UC Course  I have reviewed the triage vital signs and the nursing notes.  Pertinent labs & imaging results that were available during my care of the patient were reviewed by me and considered in my medical decision making (see chart for details).   1. Acute non-recurrent frontal sinusitis Presentation is  consistent with acute postviral bacterial sinusitis.   Symptoms have been present for greater than 7 days and have not responded well to over-the-counter therapies, therefore may have antibiotic.  Prescriptions for further symptomatic relief sent, may continue using OTC medications as needed. Deferred imaging of the chest based on stable cardiopulmonary exam and hemodynamically stable vital signs. Recommend warm compresses to the sinuses as needed. Allergic to cephalosporins, denies allergies to penicillins and states she is open to taking Augmentin  despite rash reaction with cephalosporin.   Counseled patient on potential for adverse effects with medications prescribed/recommended today, strict ER and return-to-clinic precautions discussed, patient verbalized understanding.    Final Clinical Impressions(s) / UC Diagnoses   Final diagnoses:  Acute non-recurrent frontal sinusitis     Discharge Instructions      Your evaluation shows you have a bacterial sinus infection. - Take antibiotic sent to pharmacy as directed to treat sinus infection. - Purchase Mucinex over the counter and take this every 12 hours as needed for nasal congestion and cough. - Cough medicines as needed. - Warm compresses to the cheeks and forehead as needed to help with sinus headaches as well as tylenol  as needed.  If you develop any new or worsening symptoms or if your symptoms do not start to improve, please return here or follow-up with your primary care provider. If your symptoms are severe, please go to the emergency room.    ED Prescriptions     Medication Sig Dispense Auth. Provider   amoxicillin -clavulanate (AUGMENTIN ) 875-125 MG tablet  (Status: Discontinued) Take 1 tablet by mouth every 12 (twelve) hours. 14 tablet Enedelia Dorna HERO, FNP   benzonatate  (TESSALON ) 100 MG capsule  (Status: Discontinued) Take 1 capsule (100 mg total) by mouth every 8 (eight) hours. 21 capsule Enedelia Dorna M,  FNP   benzonatate  (TESSALON ) 100 MG capsule Take 1 capsule (100 mg total) by mouth every 8 (eight) hours. 21 capsule Belissa Kooy M, FNP   amoxicillin -clavulanate (AUGMENTIN ) 875-125 MG tablet Take 1 tablet by mouth every 12 (twelve) hours. 14 tablet Enedelia Dorna HERO, FNP      PDMP not reviewed this encounter.   Enedelia Dorna HERO, FNP 08/23/23 1048    Enedelia Dorna HERO, FNP 08/23/23 1049

## 2023-08-23 NOTE — ED Triage Notes (Signed)
 Pt c/o congestion, sore throat, and mucous.

## 2023-08-23 NOTE — Discharge Instructions (Signed)
Your evaluation shows you have a bacterial sinus infection. - Take antibiotic sent to pharmacy as directed to treat sinus infection. - Purchase Mucinex over the counter and take this every 12 hours as needed for nasal congestion and cough. - Cough medicines as needed. - Warm compresses to the cheeks and forehead as needed to help with sinus headaches as well as tylenol as needed.  If you develop any new or worsening symptoms or if your symptoms do not start to improve, please return here or follow-up with your primary care provider. If your symptoms are severe, please go to the emergency room.

## 2023-09-07 ENCOUNTER — Encounter: Payer: Self-pay | Admitting: Sports Medicine

## 2023-09-07 ENCOUNTER — Ambulatory Visit: Payer: Medicare Other | Admitting: Sports Medicine

## 2023-09-07 VITALS — BP 116/80 | HR 83 | Temp 97.3°F | Resp 17 | Ht 66.0 in | Wt 204.6 lb

## 2023-09-07 DIAGNOSIS — M5442 Lumbago with sciatica, left side: Secondary | ICD-10-CM

## 2023-09-07 DIAGNOSIS — I77811 Abdominal aortic ectasia: Secondary | ICD-10-CM | POA: Diagnosis not present

## 2023-09-07 DIAGNOSIS — K219 Gastro-esophageal reflux disease without esophagitis: Secondary | ICD-10-CM

## 2023-09-07 DIAGNOSIS — Z780 Asymptomatic menopausal state: Secondary | ICD-10-CM

## 2023-09-07 DIAGNOSIS — B0229 Other postherpetic nervous system involvement: Secondary | ICD-10-CM | POA: Insufficient documentation

## 2023-09-07 DIAGNOSIS — M8580 Other specified disorders of bone density and structure, unspecified site: Secondary | ICD-10-CM

## 2023-09-07 DIAGNOSIS — I10 Essential (primary) hypertension: Secondary | ICD-10-CM

## 2023-09-07 DIAGNOSIS — G8929 Other chronic pain: Secondary | ICD-10-CM

## 2023-09-07 DIAGNOSIS — M159 Polyosteoarthritis, unspecified: Secondary | ICD-10-CM

## 2023-09-07 NOTE — Progress Notes (Signed)
Careteam: Patient Care Team: Kathy Sheffield, MD as PCP - General (Internal Medicine)  PLACE OF SERVICE:  [facility]  Advanced Directive information Does Patient Have a Medical Advance Directive?: No, Would patient like information on creating a medical advance directive?: No - Patient declined  Allergies  Allergen Reactions   Alpha-Gal Anaphylaxis    BEEF, LAMB, PORK   Phenylpropanolamine Hcl Swelling    SWELLING REACTION UNSPECIFIED    Pork Allergy Anaphylaxis, Hives and Swelling   Cephalexin Hives, Itching, Rash and Swelling    Hives and swelling    Guaifenesin Anxiety and Other (See Comments)    Every nerve in body was on edge  Unable to describe per patient   Cyclobenzaprine Hcl Other (See Comments)    Headache   Zyrtec [Cetirizine] Palpitations    Chief Complaint  Patient presents with   Establish Care    New patient.     Discussed the use of AI scribe software for clinical note transcription with the patient, who gave verbal consent to proceed.  History of Present Illness   The patient, with osteoarthritis and degenerative disc disease, presents for establishment of care   DJD lumbar spine with sciatica  She is planning to have spine surgery on February 11th at [facility]. She has been living in [place] for the past four years for financial reasons but has now moved back to [place]. She is considering establishing pain management care in [place] as she was previously following pain management in [place]. She currently takes hydrocodone (up to two per day), duloxetine, and Lyrica for pain management.  Her osteoarthritis and degenerative disc disease began in her cervical spine 23 years ago and have affected various parts of her spine over the years. Approximately two and a half to three years ago, she experienced a significant fall that exacerbated her lumbar spine issues, leading to severe sciatic pain radiating from her lower back down her buttocks, left thigh,  and calf. She has received injections in the past for pain management but is concerned about the impact of steroid injections on her bone density.   Recent sinusitis  She recently had a sinus infection that delayed a previously scheduled surgery in January. She was treated with Augmentin without any adverse reactions.    Multiple allergies She has multiple allergies and receives Xolair injections from Dr. Malachi Bonds in [place].   Abdominal aortic aneurysm  Her past medical history includes a small abdominal aortic aneurysm, which has grown from 1.7 cm to 2.1 cm, and a history of a transient ischemic attack (TIA). She was previously on aspirin but stopped due to running out of the medication.   HTN  She is on hydrochlorothiazide for blood pressure management, which is well-controlled.   GERD Prilosec 20 mg daily for heartburn, which is effective.   She has not had any falls in the past six months and does not experience tingling or numbness in her feet.         Review of Systems:  Review of Systems  Constitutional:  Negative for chills and fever.  HENT:  Negative for congestion and sore throat.   Eyes:  Negative for double vision.  Respiratory:  Negative for cough, sputum production and shortness of breath.   Cardiovascular:  Negative for chest pain, palpitations and leg swelling.  Gastrointestinal:  Negative for abdominal pain, heartburn and nausea.  Genitourinary:  Negative for dysuria, frequency and hematuria.  Musculoskeletal:  Positive for back pain. Negative for falls and myalgias.  Neurological:  Negative for dizziness, sensory change and focal weakness.   Negative unless indicated in HPI.   Past Medical History:  Diagnosis Date   Abdominal aortic aneurysm (AAA) (HCC)    ectatic abdominal aorta 2016   Allergy to alpha-gal    Angio-edema    Chronic urticaria 03/26/2023   DDD (degenerative disc disease), lumbar    and cervical per pt   Degenerative disc  disease    Depression    pt denies   GERD (gastroesophageal reflux disease)    Head ache    History of hiatal hernia    years ago   Hyperlipidemia    Hypoglycemia    when she doesn"t eat or have protein   Neuropathy    Stroke (HCC) 2018   TIA   Past Surgical History:  Procedure Laterality Date   COLONOSCOPY     MOUTH SURGERY  03/17/2018   removed 8 bottom teeth   TOOTH EXTRACTION N/A 03/17/2018   Procedure: DENTAL EXTRACTIONS TEETH NUMBER TWENTY ONE, TWENTY TWO, TWENTY THREE, TWENTY FOUR, TWENTY FIVE, TWENTY SIX, TWENTY SEVEN, TWENTY EIGHT, ALVEOLOPLASTY;  Surgeon: Ocie Doyne, DDS;  Location: [facility] OR;  Service: Oral Surgery;  Laterality: N/A;   Social History:   reports that she quit smoking about 4 years ago. Her smoking use included cigarettes. She has never used smokeless tobacco. She reports that she does not currently use alcohol. She reports that she does not use drugs.  Family History  Problem Relation Age of Onset   Emphysema Mother    Diabetes Mother    Heart disease Mother    Arthritis Mother    Pancreatic cancer Father    Colon cancer Father    ALS Sister    Hyperlipidemia Brother    Esophageal cancer Neg Hx    Rectal cancer Neg Hx    Stomach cancer Neg Hx    Colon polyps Neg Hx    Allergic rhinitis Neg Hx    Angioedema Neg Hx    Asthma Neg Hx    Eczema Neg Hx    Immunodeficiency Neg Hx    Urticaria Neg Hx     Medications: Patient's Medications  New Prescriptions   No medications on file  Previous Medications   ARNICA EX    Apply 1 application topically daily as needed (for pain).   ATORVASTATIN (LIPITOR) 10 MG TABLET    Take by mouth.   CALCIUM PO    Take by mouth daily.   CHOLECALCIFEROL (VITAMIN D3 PO)    Take by mouth daily.   DULOXETINE (CYMBALTA) 30 MG CAPSULE    Take 30 mg by mouth daily.   EPINEPHRINE (EPIPEN 2-PAK) 0.3 MG/0.3 ML IJ SOAJ INJECTION    Inject 0.3 mg into the muscle as needed for anaphylaxis.   HYDROCHLOROTHIAZIDE (HYDRODIURIL)  25 MG TABLET    Take 25 mg by mouth daily.   HYDROCODONE-ACETAMINOPHEN (NORCO/VICODIN) 5-325 MG TABLET    Take by mouth.   OMALIZUMAB (XOLAIR) 150 MG/ML PREFILLED SYRINGE    Inject 300 mg into the skin every 28 (twenty-eight) days.   OMEPRAZOLE (PRILOSEC) 20 MG CAPSULE    Take 20 mg by mouth daily.   PREGABALIN (LYRICA) 50 MG CAPSULE    Take 50 mg by mouth.   TIZANIDINE (ZANAFLEX) 4 MG TABLET    TAKE 1 TABLET BY MOUTH THREE TIMES DAILY   TROLAMINE SALICYLATE (ASPERCREME) 10 % CREAM    Apply 1 application topically daily as needed for muscle pain.  Modified Medications   No medications on file  Discontinued Medications   AMITRIPTYLINE (ELAVIL) 25 MG TABLET    TAKE ONE TABLET BY MOUTH AT BEDTIME   AMOXICILLIN-CLAVULANATE (AUGMENTIN) 875-125 MG TABLET    Take 1 tablet by mouth every 12 (twelve) hours.   ASPIRIN EC 81 MG TABLET    Take 81 mg by mouth daily.   BENZONATATE (TESSALON) 100 MG CAPSULE    Take 1 capsule (100 mg total) by mouth every 8 (eight) hours.    Physical Exam: Vitals:   09/07/23 1259  BP: 116/80  Pulse: 83  Resp: 17  Temp: (!) 97.3 F (36.3 C)  SpO2: 99%  Weight: 204 lb 9.6 oz (92.8 kg)  Height: 5\' 6"  (1.676 m)   Body mass index is 33.02 kg/m. BP Readings from Last 3 Encounters:  09/07/23 116/80  08/23/23 134/86  03/24/23 130/70   Wt Readings from Last 3 Encounters:  09/07/23 204 lb 9.6 oz (92.8 kg)  03/24/23 206 lb (93.4 kg)  12/16/22 210 lb (95.3 kg)    Physical Exam Constitutional:      Appearance: Normal appearance.  HENT:     Head: Normocephalic and atraumatic.  Cardiovascular:     Rate and Rhythm: Normal rate and regular rhythm.  Pulmonary:     Effort: Pulmonary effort is normal. No respiratory distress.     Breath sounds: Normal breath sounds. No wheezing.  Abdominal:     General: Bowel sounds are normal. There is no distension.     Tenderness: There is no abdominal tenderness. There is no guarding or rebound.     Comments:     Musculoskeletal:        General: No swelling or tenderness.     Comments: SLR positive left side Strength intact   Neurological:     Mental Status: She is alert. Mental status is at baseline.     Sensory: No sensory deficit.     Motor: No weakness.     Labs reviewed: Basic Metabolic Panel: No results for input(s): "NA", "K", "CL", "CO2", "GLUCOSE", "BUN", "CREATININE", "CALCIUM", "MG", "PHOS", "TSH" in the last 8760 hours. Liver Function Tests: No results for input(s): "AST", "ALT", "ALKPHOS", "BILITOT", "PROT", "ALBUMIN" in the last 8760 hours. No results for input(s): "LIPASE", "AMYLASE" in the last 8760 hours. No results for input(s): "AMMONIA" in the last 8760 hours. CBC: No results for input(s): "WBC", "NEUTROABS", "HGB", "HCT", "MCV", "PLT" in the last 8760 hours. Lipid Panel: No results for input(s): "CHOL", "HDL", "LDLCALC", "TRIG", "CHOLHDL", "LDLDIRECT" in the last 8760 hours. TSH: No results for input(s): "TSH" in the last 8760 hours. A1C: No results found for: "HGBA1C"  CBC  [facility]01/03/2024 Component 08/17/2023 05/02/2023 08/26/2022 05/14/2021          WBC (White Blood Cell Count) 6.5 5.6 6.8 10.4 High    Load older lab results  Hemoglobin 12.2 11.9 12.2 9.6 Low    Load older lab results  Hematocrit 38.5 36.3 38.7 29.8 Low    Load older lab results  Platelets 277 287 291 277 Load older lab results  MCV (Mean Corpuscular Volume) 87 87 85 90 Load older lab results  MCH (Mean Corpuscular Hemoglobin) 27.5 28.4 26.9 28.9 Load older lab results  MCHC (Mean Corpuscular Hemoglobin Concentration) 31.7 32.8 31.5 32.2 Load older lab results  RBC (Red Blood Cell Count) 4.43 4.19 4.53 3.32 Low    Load older lab results  RDW-CV (Red Cell Distribution Width) 14.6 High  16.3 High    14.9 High     NRBC (Nucleated Red Blood Cell Count) 0 0.00 0.00 0.00 Load older lab results  NRBC % (Nucleated Red Blood Cell %) 0 0.0 0.0 0.0 Load older lab results   MPV (Mean Platelet Volume) 10.8 12.1 High    11.0 11.1 Load older lab results  Immature Granulocyte Count -- 0.01 0.01 0.05 Load older lab results  Immature Granulocyte % -- 0.2 0.1     Sodium 136 137 136 136 136 -- -- Load older lab results  Potassium 3.6 3.4 Low    3.4 Low    3.7 3.8 -- -- Load older lab results  Chloride 98 96 Low    100 98 103 -- -- Load older lab results  Carbon Dioxide (CO2) 29 28 27 26 26  -- -- Load older lab results  Urea Nitrogen (BUN) 16 19 15 17 12  -- -- Load older lab results  Creatinine 1 1.0 0.9 0.9 0.9 -- -- Load older lab results  Glucose 97 92 122 94 123 -- -- Load older lab results  Calcium 9.5 9.4 9.2 9.4 8.5 Low    -- -- Load older lab results  AST (Aspartate Aminotransferase) -- 18 -- 21 13 Low    -- 19 Load older lab results  ALT (Alanine Aminotransferase) -- 13 -- 15 11 Low    14 -- Load older lab results  Bilirubin, Total -- 0.5 -- 0.4 0.6 -- -- Load older lab results  Alk Phos (Alkaline Phosphatase) -- 117 High    -- 101 134 High    -- -- Load older lab results  Albumin -- 3.6 -- 3.8 2.8 Low    -- -- Load older lab results  Protein, Total -- 6.7 -- 7.0 5.6 Low    -- -- Load older lab results  Anion Gap 9 13 High    9 12 7  -- -- Load older lab results  BUN/CREA Ratio 16 19 17 19 14  -- -- Load older lab results  Glomerular Filtration Rate (eGFR) 63 63 72 72 73 -- -- Load older lab results    Assessment/Plan  Lower Back Pain and Sciatica Chronic pain due to osteoarthritis and degenerative disc disease, exacerbated by a fall approximately 3 years ago. Pain is localized in the lower back and radiates down the left leg. Patient is scheduled for spine surgery on February 11th. -Referral to local pain management for ongoing management of pain medications. -Continue current pain management regimen (Hydrocodone up to 2x daily, Duloxetine, Lyrica) until appointment with new pain management provider. -Discontinue Diclofenac 1 week prior to  surgery to reduce risk of bleeding.  Abdominal Aortic Aneurysm Slowly growing aneurysm, currently 2.1 cm. Patient has a history of TIA and was previously on aspirin therapy. -Resume aspirin therapy post-surgery to manage aneurysm growth and reduce risk of TIA. Cont with lipitor   Osteopenia Patient has concerns about bone density, particularly in relation to steroid injections for pain management. -Schedule bone density test as patient is due for follow-up.  HTN  Cont with HCTZ  General Health Maintenance. -Consider resuming baby aspirin post-surgery. -Continue B12 supplementation as directed. -Schedule mammogram as patient is due for screening.- Pt declined  -Follow-up in clinic 3 months post-surgery,    45 min Total time spent for obtaining history,  performing a medically appropriate examination and evaluation, reviewing the tests,ordering  tests,  documenting clinical information in the electronic or other health record,  ,care coordination (not separately reported)

## 2023-09-09 ENCOUNTER — Encounter: Payer: Self-pay | Admitting: Physical Medicine & Rehabilitation

## 2023-09-10 ENCOUNTER — Ambulatory Visit
Admission: RE | Admit: 2023-09-10 | Discharge: 2023-09-10 | Disposition: A | Discharge status: Home / Self Care | Payer: Medicare Other | Source: Ambulatory Visit | Attending: Sports Medicine | Admitting: Sports Medicine

## 2023-09-10 DIAGNOSIS — M8580 Other specified disorders of bone density and structure, unspecified site: Secondary | ICD-10-CM

## 2023-09-10 DIAGNOSIS — Z780 Asymptomatic menopausal state: Secondary | ICD-10-CM

## 2023-09-12 ENCOUNTER — Encounter: Payer: Self-pay | Admitting: Physical Medicine & Rehabilitation

## 2023-09-12 ENCOUNTER — Encounter: Payer: Medicare Other | Attending: Physical Medicine & Rehabilitation | Admitting: Physical Medicine & Rehabilitation

## 2023-09-12 VITALS — BP 108/74 | HR 70 | Ht 66.0 in | Wt 206.0 lb

## 2023-09-12 DIAGNOSIS — M533 Sacrococcygeal disorders, not elsewhere classified: Secondary | ICD-10-CM | POA: Diagnosis present

## 2023-09-12 DIAGNOSIS — G8929 Other chronic pain: Secondary | ICD-10-CM | POA: Insufficient documentation

## 2023-09-12 DIAGNOSIS — M5442 Lumbago with sciatica, left side: Secondary | ICD-10-CM | POA: Diagnosis not present

## 2023-09-12 DIAGNOSIS — Z79891 Long term (current) use of opiate analgesic: Secondary | ICD-10-CM | POA: Diagnosis present

## 2023-09-12 DIAGNOSIS — Z5181 Encounter for therapeutic drug level monitoring: Secondary | ICD-10-CM | POA: Diagnosis not present

## 2023-09-12 DIAGNOSIS — G894 Chronic pain syndrome: Secondary | ICD-10-CM | POA: Diagnosis present

## 2023-09-12 DIAGNOSIS — M2550 Pain in unspecified joint: Secondary | ICD-10-CM | POA: Diagnosis present

## 2023-09-12 NOTE — Progress Notes (Signed)
Subjective:    Patient ID: Kathy Schaefer, female    DOB: November 28, 1960, 63 y.o.   MRN: [id number]  HPI  HPI  Kathy Schaefer is a 63 y.o. year old female  who  has a past medical history of Abdominal aortic aneurysm (AAA) (HCC), Allergy to alpha-gal, Angio-edema, Chronic urticaria (03/26/2023), DDD (degenerative disc disease), lumbar, Degenerative disc disease, Depression, GERD (gastroesophageal reflux disease), Head ache, History of hiatal hernia, Hyperlipidemia, Hypoglycemia, Neuropathy, and Stroke (HCC) (2018).   They are presenting to PM&R clinic as a new patient for pain management evaluation. They were referred by Dr. Jacquenette Shone for treatment of chronic pain.    Reports chronic low back pain with left-sided sciatica for the past 2 and half years.  Pain is worsened by standing and walking.  Sitting also causes pain.  Even laying down he has to change positions frequently.  She says pain feels stabbing, hot, searing like needles.  She reports if she stands on her left leg will go numb.  Sciatica is worse than back pain itself.  Previous sciatica went down to her lateral ankle, now radiates down to her lateral calf.  She reports arthritis pain is joints throughout her body.  Reports her shoulders and elbows do not hurt much but her hips, knees, ankles and neck are often painful.  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  She previously had multiple types of steroid injections.  She would like to avoid steroid injections because she has osteoporosis and does not want to make her bones and joints weaker.  She is following with pain clinic in [place].  Currently on Lyrica, Cymbalta, oxycodone.  She she reports she is scheduled for lumbar microdiscectomy surgery next Tuesday with Dr. Lottie Mussel in [place].    Red flag symptoms: No red flags for back pain endorsed in Hx or ROS  Medications tried: Topical medications- aspercream  Nsaids - She takes diclofenac Tylenol  - Doesn't help  much  Opiates  Hydrocodone- 5/325 Tramadol- stopped working  Gabapentin - used in the past  Lyrica - She uses this med, helps better then gabapentin TCAs  - amitriptyline-used in the past  SNRIs  - Duloxetine- helps with her pain    Other treatments: PT- minimal benefit  TENs unit - Helps a little for her neck and shoulder blades, doesn't help lower back  Injections - ESI x3 without benefit, 1 injection helped for about a week, B/L SI joint injections in the past  Surgery - scheduled as above     Prior UDS results:     Component Value Date/Time   LABOPIA NONE DETECTED 06/10/2017 1229   COCAINSCRNUR NONE DETECTED 06/10/2017 1229   LABBENZ NONE DETECTED 06/10/2017 1229   AMPHETMU NONE DETECTED 06/10/2017 1229   THCU NONE DETECTED 06/10/2017 1229   LABBARB NONE DETECTED 06/10/2017 1229      Pain Inventory Average Pain 5 Pain Right Now 4 My pain is intermittent, constant, burning, stabbing, tingling, and cramping  In the last 24 hours, has pain interfered with the following? General activity 4 Relation with others 3 Enjoyment of life 6 What TIME of day is your pain at its worst? evening, night, and varies Sleep (in general) Good  Pain is worse with: walking, bending, sitting, standing, and some activites Pain improves with: heat/ice, pacing activities, and medication Relief from Meds: 6  walk without assistance how many minutes can you walk? 5 ability to climb steps?  yes do you drive?  yes  disabled: date disabled 2018 I need assistance with the following:  household duties and shopping  numbness tingling trouble walking spasms  Any changes since last visit?  no  Primary care Prashanthi MD  Was  in [place] and used [facility] but has moved back to [place]   Family History  Problem Relation Age of Onset   Emphysema Mother    Diabetes Mother    Heart disease Mother    Arthritis Mother    Pancreatic cancer Father    Colon cancer Father    ALS Sister     Hyperlipidemia Brother    Esophageal cancer Neg Hx    Rectal cancer Neg Hx    Stomach cancer Neg Hx    Colon polyps Neg Hx    Allergic rhinitis Neg Hx    Angioedema Neg Hx    Asthma Neg Hx    Eczema Neg Hx    Immunodeficiency Neg Hx    Urticaria Neg Hx    Social History   Socioeconomic History   Marital status: Divorced    Spouse name: Not on file   Number of children: Not on file   Years of education: Not on file   Highest education level: GED or equivalent  Occupational History   Not on file  Tobacco Use   Smoking status: Former    Current packs/day: 0.00    Types: Cigarettes    Quit date: 08/10/2019    Years since quitting: 4.0   Smokeless tobacco: Never  Vaping Use   Vaping status: Former  Substance and Sexual Activity   Alcohol use: Not Currently   Drug use: No   Sexual activity: Yes  Other Topics Concern   Not on file  Social History Narrative   Not on file   Social Drivers of Health   Financial Resource Strain: High Risk (09/03/2023)   Overall Financial Resource Strain (CARDIA)    Difficulty of Paying Living Expenses: Very hard  Food Insecurity: Food Insecurity Present (09/03/2023)   Hunger Vital Sign    Worried About Running Out of Food in the Last Year: Sometimes true    Ran Out of Food in the Last Year: Sometimes true  Transportation Needs: No Transportation Needs (09/03/2023)   PRAPARE - Administrator, Civil Service (Medical): No    Lack of Transportation (Non-Medical): No  Physical Activity: Insufficiently Active (09/03/2023)   Exercise Vital Sign    Days of Exercise per Week: 3 days    Minutes of Exercise per Session: 30 min  Stress: No Stress Concern Present (09/03/2023)   [employer] of Occupational Health - Occupational Stress Questionnaire    Feeling of Stress : Only a little  Social Connections: Unknown (09/03/2023)   Social Connection and Isolation Panel [NHANES]    Frequency of Communication with Friends and Family: Once  a week    Frequency of Social Gatherings with Friends and Family: Patient declined    Attends Religious Services: Never    Database administrator or Organizations: No    Attends Engineer, structural: Not on file    Marital Status: Divorced   Past Surgical History:  Procedure Laterality Date   COLONOSCOPY     MOUTH SURGERY  03/17/2018   removed 8 bottom teeth   TOOTH EXTRACTION N/A 03/17/2018   Procedure: DENTAL EXTRACTIONS TEETH NUMBER TWENTY ONE, TWENTY TWO, TWENTY THREE, TWENTY FOUR, TWENTY FIVE, TWENTY SIX, TWENTY SEVEN, TWENTY EIGHT, ALVEOLOPLASTY;  Surgeon: Ocie Doyne, DDS;  Location: [facility] OR;  Service: Oral Surgery;  Laterality: N/A;   Past Medical History:  Diagnosis Date   Abdominal aortic aneurysm (AAA) (HCC)    ectatic abdominal aorta 2016   Allergy to alpha-gal    Angio-edema    Chronic urticaria 03/26/2023   DDD (degenerative disc disease), lumbar    and cervical per pt   Degenerative disc disease    Depression    pt denies   GERD (gastroesophageal reflux disease)    Head ache    History of hiatal hernia    years ago   Hyperlipidemia    Hypoglycemia    when she doesn"t eat or have protein   Neuropathy    Stroke (HCC) 2018   TIA   BP 108/74   Pulse 70   Ht 5\' 6"  (1.676 m)   Wt 206 lb (93.4 kg)   SpO2 94%   BMI 33.25 kg/m   Opioid Risk Score:   Fall Risk Score:  `1  Depression screen [facility] 2/9     09/12/2023    3:13 PM 09/07/2023    1:27 PM  Depression screen PHQ 2/9  Decreased Interest 0 0  Down, Depressed, Hopeless 0 0  PHQ - 2 Score 0 0  Altered sleeping 0   Tired, decreased energy 0   Change in appetite 0   Feeling bad or failure about yourself  0   Trouble concentrating 0   Moving slowly or fidgety/restless 0   Suicidal thoughts 0   PHQ-9 Score 0     Review of Systems  Musculoskeletal:  Positive for arthralgias, back pain and gait problem.  Neurological:  Positive for numbness.       Tingling  All other systems reviewed and are  negative.     Objective:   Physical Exam   Gen: no distress, normal appearing, has support dog-poodle  HEENT: oral mucosa pink and moist, NCAT Chest: normal effort, normal rate of breathing Abd: soft, non-distended Ext: no edema Psych: pleasant, normal affect Skin: intact Neuro: Alert and awake, follows commands, cranial nerves II through XII grossly intact, normal speech and language RUE: 5/5 Deltoid, 5/5 Biceps, 5/5 Triceps, 5/5 Wrist Ext, 5/5 Grip LUE: 5/5 Deltoid, 5/5 Biceps, 5/5 Triceps, 5/5 Wrist Ext, 5/5 Grip RLE: HF 5/5, KE 5/5, ADF 5/5, APF 5/5 LLE: HF 5/5, KE 5/5, ADF 5/5, APF 5/5 Sensory exam normal for light touch and pain in all 4 limbs. No limb ataxia or cerebellar signs. No abnormal tone appreciated.  No abnormal tone noted Musculoskeletal:  TTP b/l Hands Mild TTP b/l Knees and ankles Gait normal, has difficulty walking on does and heels Pain reported with back extension Facet loading equivocal Slump test positive on the left Patient reports very uncomfortable to lay on her back TTP L spine Mild TTP b/l SI joints   L spine MRI 04/27/23 FINDINGS:  Anatomical variants: Conventional spinal numbering.  Conus: The conus is normal in appearance and terminates at L1.  Alignment: 3 mm anterolisthesis of L4 on L5.  Marrow: The visualized bone marrow signal is normal.      T11-T12: Sagittal images only. Small broad-based disc bulge. No central  canal or neural foraminal stenosis.   T12-L1: Small broad-based disc bulge. No central canal or neuroforaminal  stenosis. Mild bilateral facet hypertrophy.   L1-L2: Small broad-based disc bulge. No central canal stenosis. No neural  foraminal stenosis. Mild bilateral facet hypertrophy.   L2-L3: Small broad-based disc bulge. No central canal stenosis. Mild left  foraminal stenosis. No significant right neural foraminal stenosis. Mild to  moderate facet and ligamentum flavum hypertrophy. Narrowing of the lateral   recesses, left greater than right.   L3-L4: Intervertebral disc space narrowing. Large broad-based disc bulge.  Mild central canal stenosis. Severe right foraminal stenosis secondary to  above and moderate facet and ligamentum flavum hypertrophy. Abutment of the  exiting nerve root. No significant left foraminal stenosis.   L4-L5: Intervertebral disc space narrowing. Unroofing of the disc. Moderate  bilateral facet and ligamentum flavum hypertrophy resulting in  mild-to-moderate central canal stenosis. Narrowing of the lateral recesses.  Mild left foraminal stenosis. No significant right neural foraminal  stenosis.   L5-S1: Intervertebral disc space narrowing. Large broad-based disc bulge  with superimposed small central disc protrusion. No significant central  canal stenosis. Severe left foraminal stenosis. Mild right foraminal  stenosis   Visualized upper sacrum: Unremarkable.   Visualized soft tissues: Colonic diverticulosis.    IMPRESSION:  1. Moderate multilevel degenerative disc changes in the spine.  2.  Moderate to severe multilevel foraminal stenosis as above most notable  at L3-L4 and L5-S1.   X ray Spine flex/exten 06/14/23  FINDINGS AND IMPRESSION:  Moderate atherosclerotic calcifications involving abdominal aorta and iliac  arteries. Incidental note of aneurysmal dilatation of mid to distal  abdominal aorta measuring 3.6 cm anteroposteriorly without significant  change. Sonographic surveillance suggested.   5 lumbar type vertebrae suspected with last full disc space labeled L5-S1.  8 mm Grade 1 anterolisthesis of L4 on L5 without significant change noted  in neutral position which is stable in flexion and reduces to 5 mm in  extension. Alignment otherwise stable between flexion and extension.  Moderate degenerative disc space narrowing at L4-5 and L5-S1, unchanged.  Moderate facet joint degenerative changes in lower lumbar spine. Negative  for definite acute  fracture or dislocation.    C spine MRI 04/03/13   Findings: Normal overall alignment of the cervical vertebral  bodies.  Stable degenerative cervical spondylosis with minimal  multilevel degenerative subluxations and facet disease.  The  vertebral bodies demonstrate normal marrow signal.  The cervical  spinal cord demonstrates normal signal intensity.  No Chiari  malformation.   C2-3: No significant findings.   C3-4:  No significant findings.   C4-5:  No significant findings.   C5-6:  Degenerative disease with a diffuse bulging annulus, shallow  broad-based central disc protrusion, osteophytic ridging and  uncinate spurring.  There is mild flattening of the ventral thecal  sac and narrowing of the ventral CSF space.  There is moderate  foraminal stenosis bilaterally.   C6-7:  Diffuse bulging annulus, osteophytic ridging and uncinate  spurring contributing to mild right foraminal stenosis and moderate  left foraminal stenosis.  No spinal stenosis.   C7-T1:  No significant findings.   IMPRESSION:   Disc protrusions, osteophytic ridging and uncinate spurring along  with facet disease at C5-6 and C6-7 contributing to bilateral  foraminal stenosis.   Xray Right knee 2020 Mild degenerative joint disease is noted medially. No acute abnormality seen in the right knee.     Assessment & Plan:  1) Chronic lower back pain with left sided sciatica 2) Polyarthralgia, Possible fibromyalgia?- she reports someone put It in my chart 3) Hx of SI joint pain  4) History of IBS  --Food for pain discussed,list provided -ORT -Plan to continue Lyrica 150mg  BID -Plan to continue  Duloxetine current dose 20mg ? daily -Plan to continue Tizanidine 4mg ? PRN -Advised getting records  from current pain clinic  -Pt scheduled for left L5/S1 transforaminal discectomy on for lumbar radiculopathy next month- ok from my perspective if surgeon needs to order medication for post operative pain, do not  take with current medication. Advised checking with her current pain clinic regarding this until we take over treatment  -Get UDS and pill counts. Consider continuation of norco 5 BID -PDMP monitoring -Discussed bringing pill bottle with any medications even if empty to all appointments

## 2023-09-14 ENCOUNTER — Telehealth: Payer: Medicare Other | Admitting: Sports Medicine

## 2023-09-23 ENCOUNTER — Emergency Department (HOSPITAL_COMMUNITY)
Admission: EM | Admit: 2023-09-23 | Discharge: 2023-09-23 | Disposition: A | Discharge status: Home / Self Care | Payer: Medicare Other | Attending: Emergency Medicine | Admitting: Emergency Medicine

## 2023-09-23 ENCOUNTER — Encounter (HOSPITAL_COMMUNITY): Payer: Self-pay | Admitting: Emergency Medicine

## 2023-09-23 ENCOUNTER — Emergency Department (HOSPITAL_COMMUNITY): Payer: Medicare Other

## 2023-09-23 DIAGNOSIS — J101 Influenza due to other identified influenza virus with other respiratory manifestations: Secondary | ICD-10-CM | POA: Diagnosis not present

## 2023-09-23 DIAGNOSIS — R531 Weakness: Secondary | ICD-10-CM | POA: Diagnosis not present

## 2023-09-23 DIAGNOSIS — R079 Chest pain, unspecified: Secondary | ICD-10-CM | POA: Diagnosis present

## 2023-09-23 DIAGNOSIS — Z20822 Contact with and (suspected) exposure to covid-19: Secondary | ICD-10-CM | POA: Diagnosis not present

## 2023-09-23 LAB — CBC WITH DIFFERENTIAL/PLATELET
Abs Immature Granulocytes: 0.01 10*3/uL (ref 0.00–0.07)
Basophils Absolute: 0 10*3/uL (ref 0.0–0.1)
Basophils Relative: 0 %
Eosinophils Absolute: 0 10*3/uL (ref 0.0–0.5)
Eosinophils Relative: 1 %
HCT: 36.5 % (ref 36.0–46.0)
Hemoglobin: 11.2 g/dL — ABNORMAL LOW (ref 12.0–15.0)
Immature Granulocytes: 0 %
Lymphocytes Relative: 17 %
Lymphs Abs: 0.6 10*3/uL — ABNORMAL LOW (ref 0.7–4.0)
MCH: 28 pg (ref 26.0–34.0)
MCHC: 30.7 g/dL (ref 30.0–36.0)
MCV: 91.3 fL (ref 80.0–100.0)
Monocytes Absolute: 0.3 10*3/uL (ref 0.1–1.0)
Monocytes Relative: 8 %
Neutro Abs: 2.8 10*3/uL (ref 1.7–7.7)
Neutrophils Relative %: 74 %
Platelets: 193 10*3/uL (ref 150–400)
RBC: 4 MIL/uL (ref 3.87–5.11)
RDW: 15.4 % (ref 11.5–15.5)
WBC: 3.7 10*3/uL — ABNORMAL LOW (ref 4.0–10.5)
nRBC: 0 % (ref 0.0–0.2)

## 2023-09-23 LAB — COMPREHENSIVE METABOLIC PANEL
ALT: 48 U/L — ABNORMAL HIGH (ref 0–44)
AST: 33 U/L (ref 15–41)
Albumin: 3.4 g/dL — ABNORMAL LOW (ref 3.5–5.0)
Alkaline Phosphatase: 136 U/L — ABNORMAL HIGH (ref 38–126)
Anion gap: 9 (ref 5–15)
BUN: 15 mg/dL (ref 8–23)
CO2: 28 mmol/L (ref 22–32)
Calcium: 8.8 mg/dL — ABNORMAL LOW (ref 8.9–10.3)
Chloride: 99 mmol/L (ref 98–111)
Creatinine, Ser: 0.78 mg/dL (ref 0.44–1.00)
GFR, Estimated: >60 mL/min (ref >60)
Glucose, Bld: 93 mg/dL (ref 70–99)
Potassium: 3.8 mmol/L (ref 3.5–5.1)
Sodium: 136 mmol/L (ref 135–145)
Total Bilirubin: 0.4 mg/dL (ref 0.0–1.2)
Total Protein: 6.3 g/dL — ABNORMAL LOW (ref 6.5–8.1)

## 2023-09-23 LAB — URINALYSIS, ROUTINE W REFLEX MICROSCOPIC
Bilirubin Urine: NEGATIVE
Glucose, UA: NEGATIVE mg/dL
Hgb urine dipstick: NEGATIVE
Ketones, ur: NEGATIVE mg/dL
Leukocytes,Ua: NEGATIVE
Nitrite: NEGATIVE
Protein, ur: NEGATIVE mg/dL
Specific Gravity, Urine: 1.016 (ref 1.005–1.030)
pH: 7 (ref 5.0–8.0)

## 2023-09-23 LAB — TROPONIN I (HIGH SENSITIVITY)
Troponin I (High Sensitivity): 3 ng/L (ref <18)
Troponin I (High Sensitivity): 4 ng/L (ref <18)

## 2023-09-23 LAB — RESP PANEL BY RT-PCR (RSV, FLU A&B, COVID)  RVPGX2
Influenza A by PCR: POSITIVE — AB
Influenza B by PCR: NEGATIVE
Resp Syncytial Virus by PCR: NEGATIVE
SARS Coronavirus 2 by RT PCR: NEGATIVE

## 2023-09-23 LAB — D-DIMER, QUANTITATIVE: D-Dimer, Quant: 0.47 ug{FEU}/mL (ref 0.00–0.50)

## 2023-09-23 MED ORDER — OSELTAMIVIR PHOSPHATE 75 MG PO CAPS: 75.0000 mg | ORAL_CAPSULE | Freq: Two times a day (BID) | ORAL | 0 refills | Status: DC

## 2023-09-23 MED ORDER — SODIUM CHLORIDE 0.9 % IV BOLUS
1000.0000 mL | Freq: Once | INTRAVENOUS | Status: AC
  Administered 2023-09-23: 1000 mL via INTRAVENOUS

## 2023-09-23 MED ORDER — ACETAMINOPHEN 325 MG PO TABS
650.0000 mg | ORAL_TABLET | Freq: Once | ORAL | Status: AC
  Administered 2023-09-23: 650 mg via ORAL
  Filled 2023-09-23: qty 2

## 2023-09-23 NOTE — ED Provider Notes (Signed)
[place] EMERGENC[facility] DEPARTMENT AT [facility] Provider Note   CSN: [id number] Arrival date & time: 09/23/23  4742     History  No chief complaint on file.   Kathy Schaefer is a 63 y.o. female.  Patient recently had back surgery.  She complains of weakness and pain in her chest  The history is provided by the patient and medical records. No language interpreter was used.  Chest Pain Pain location:  Substernal area Pain quality: aching   Pain radiates to:  Does not radiate Pain severity:  Mild Onset quality:  Sudden Timing:  Constant Progression:  Waxing and waning Chronicity:  New Associated symptoms: no abdominal pain, no back pain, no cough, no fatigue and no headache        Home Medications Prior to Admission medications   Medication Sig Start Date End Date Taking? Authorizing Provider  ARNICA EX Apply 1 application topically daily as needed (for pain).    [provider]  atorvastatin (LIPITOR) 10 MG tablet Take by mouth. 05/27/21   [provider]  CALCIUM PO Take by mouth daily.    [provider]  Cholecalciferol (VITAMIN D3 PO) Take by mouth daily.    [provider]  DULoxetine (CYMBALTA) 30 MG capsule Take 30 mg by mouth daily. 08/25/23   [provider]  EPINEPHrine (EPIPEN 2-PAK) 0.3 mg/0.3 mL IJ SOAJ injection Inject 0.3 mg into the muscle as needed for anaphylaxis. 03/24/23   Hetty Blend, FNP  hydrochlorothiazide (HYDRODIURIL) 25 MG tablet Take 25 mg by mouth daily.    [provider]  HYDROcodone-acetaminophen (NORCO/VICODIN) 5-325 MG tablet Take by mouth. 05/12/22   [provider]  omalizumab Geoffry Paradise) 150 MG/ML prefilled syringe Inject 300 mg into the skin every 28 (twenty-eight) days. 10/12/22   Alfonse Spruce, MD  omeprazole (PRILOSEC) 20 MG capsule Take 20 mg by mouth daily.    [provider]  pregabalin (LYRICA) 50 MG capsule Take 50 mg by mouth. 10/08/21   [provider]  tiZANidine (ZANAFLEX) 4 MG tablet TAKE 1 TABLET BY MOUTH THREE TIMES DAILY 05/26/18   [provider]  trolamine salicylate (ASPERCREME) 10 % cream Apply 1 application topically daily as needed for muscle pain.    [provider]      Allergies    Alpha-gal, Phenylpropanolamine hcl, Pork allergy, Cephalexin, Guaifenesin, Cyclobenzaprine hcl, and Zyrtec [cetirizine]    Review of Systems   Review of Systems  Constitutional:  Negative for appetite change and fatigue.  HENT:  Negative for congestion, ear discharge and sinus pressure.   Eyes:  Negative for discharge.  Respiratory:  Negative for cough.   Cardiovascular:  Positive for chest pain.  Gastrointestinal:  Negative for abdominal pain and diarrhea.  Genitourinary:  Negative for frequency and hematuria.  Musculoskeletal:  Negative for back pain.  Skin:  Negative for rash.  Neurological:  Negative for seizures and headaches.  Psychiatric/Behavioral:  Negative for hallucinations.     Physical Exam Updated Vital Signs BP (!) 163/87   Pulse 74   Temp (!) 102.5 F (39.2 C) (Oral)   Resp 16   SpO2 94%  Physical Exam Vitals and nursing note reviewed.  Constitutional:      Appearance: She is well-developed.  HENT:     Head: Normocephalic.     Nose: Nose normal.  Eyes:     General: No scleral icterus.    Conjunctiva/sclera: Conjunctivae normal.  Neck:  Thyroid: No thyromegaly.  Cardiovascular:     Rate and Rhythm: Normal rate and regular rhythm.     Heart sounds: No murmur heard.    No friction rub. No gallop.  Pulmonary:     Breath sounds: No stridor. No wheezing or rales.  Chest:     Chest wall: No tenderness.  Abdominal:     General: There is no distension.     Tenderness: There is no abdominal tenderness. There is no rebound.  Musculoskeletal:        General: Normal range of motion.     Cervical back: Neck supple.  Lymphadenopathy:     Cervical: No cervical adenopathy.   Skin:    Findings: No erythema or rash.  Neurological:     Mental Status: She is oriented to person, place, and time.     Motor: No abnormal muscle tone.     Coordination: Coordination normal.  Psychiatric:        Behavior: Behavior normal.     ED Results / Procedures / Treatments   Labs (all labs ordered are listed, but only abnormal results are displayed) Labs Reviewed  CBC WITH DIFFERENTIAL/PLATELET - Abnormal; Notable for the following components:      Result Value   WBC 3.7 (*)    Hemoglobin 11.2 (*)    Lymphs Abs 0.6 (*)    All other components within normal limits  COMPREHENSIVE METABOLIC PANEL  D-DIMER, QUANTITATIVE  TROPONIN I (HIGH SENSITIVITY)    EKG None  Radiology No results found.  Procedures Procedures    Medications Ordered in ED Medications - No data to display  ED Course/ Medical Decision Making/ A&P                                 Medical Decision Making Amount and/or Complexity of Data Reviewed Labs: ordered. Radiology: ordered.  Risk OTC drugs. Prescription drug management.   This patient presents to the ED for concern of weakness and chest pain, this involves an extensive number of treatment options, and is a complaint that carries with it a high risk of complications and morbidity.  The differential diagnosis includes pneumonia, PE, MI, viral syndrome   Co morbidities that complicate the patient evaluation  Recent back surgery   Additional history obtained:  Additional history obtained from patient External records from outside source obtained and reviewed including hospital records   Lab Tests:  I Ordered, and personally interpreted labs.  The pertinent results include: Troponin and D-dimer normal, influenza A positive   Imaging Studies ordered:  I ordered imaging studies including chest x-ray I independently visualized and interpreted imaging which showed negative I agree with the radiologist  interpretation   Cardiac Monitoring: / EKG:  The patient was maintained on a cardiac monitor.  I personally viewed and interpreted the cardiac monitored which showed an underlying rhythm of: Normal rhythm   Consultations Obtained:  No consultant  Problem List / ED Course / Critical interventions / Medication management  Influenza A I ordered medication including Tamiflu for influenza A Reevaluation of the patient after these medicines showed that the patient stayed the same I have reviewed the patients home medicines and have made adjustments as needed   Social Determinants of Health:  None   Test / Admission - Considered:  None    Patient with influenza.  She is given Tamiflu and will follow-up with her PCP  Final Clinical Impression(s) / ED Diagnoses Final diagnoses:  None    Rx / DC Orders ED Discharge Orders     None         Bethann Berkshire, MD 09/26/23 1129

## 2023-09-23 NOTE — ED Triage Notes (Signed)
Pt here from home with c/o  chest  pain across her chest, 100 mcg fentanyl by ems, had lower back surgery at [facility] on the 11th ,

## 2023-09-23 NOTE — Discharge Instructions (Signed)
3 plenty of fluids and take Tylenol for any fever or aches.  Follow-up with your primary care doctor as needed

## 2023-09-23 NOTE — ED Notes (Signed)
Urine sent to the lab.

## 2023-09-27 ENCOUNTER — Telehealth: Payer: Self-pay | Admitting: Allergy & Immunology

## 2023-09-27 MED ORDER — OMALIZUMAB 150 MG/ML ~~LOC~~ SOSY
300.0000 mg | PREFILLED_SYRINGE | SUBCUTANEOUS | 11 refills | Status: AC
Start: 2023-09-27 — End: ?

## 2023-09-27 NOTE — Telephone Encounter (Signed)
Patient called stating she is needing a prescription for her Xolair from Dr Dellis Anes.

## 2023-09-27 NOTE — Telephone Encounter (Signed)
Rx sent, patient advised 

## 2023-10-11 ENCOUNTER — Telehealth: Payer: Medicare Other | Admitting: Sports Medicine

## 2023-10-27 ENCOUNTER — Ambulatory Visit: Payer: Medicare Other | Admitting: Physical Medicine & Rehabilitation

## 2023-11-14 ENCOUNTER — Encounter: Payer: Self-pay | Admitting: Internal Medicine

## 2023-11-26 ENCOUNTER — Other Ambulatory Visit: Payer: Self-pay

## 2023-11-26 ENCOUNTER — Ambulatory Visit
Admission: EM | Admit: 2023-11-26 | Discharge: 2023-11-26 | Disposition: A | Discharge status: Home / Self Care | Attending: Physician Assistant | Admitting: Physician Assistant

## 2023-11-26 DIAGNOSIS — L299 Pruritus, unspecified: Secondary | ICD-10-CM | POA: Diagnosis not present

## 2023-11-26 MED ORDER — TRIAMCINOLONE ACETONIDE 40 MG/ML IJ SUSP
40.0000 mg | Freq: Once | INTRAMUSCULAR | Status: AC
  Administered 2023-11-26: 40 mg via INTRAMUSCULAR

## 2023-11-26 MED ORDER — HYDROXYZINE HCL 25 MG PO TABS: 25.0000 mg | ORAL_TABLET | Freq: Three times a day (TID) | ORAL | 0 refills | Status: DC | PRN

## 2023-11-26 NOTE — Discharge Instructions (Addendum)
 You were seen today for concerns for generalized itching At this time I suspect you likely come into contact with an allergen that has caused you to have the itching sensation all over. You did not appear to have a rash and you are not having any trouble breathing or swelling in your throat or mouth which would constitute a severe allergic reaction.  At this time we have provided you with a Kenalog  40 mg injection to assist with the itching and discomfort. I recommend that you continue taking your loratadine daily as this can help with allergic reactions and itching as well.  I have also sent a medication for you to take called hydroxyzine .  You can take this up to every 8 hours as needed for severe itching symptoms.  This medication can be sedating so please do not take it if you need to remain alert or drive. If at any point you start to develop swelling of the tongue or throat, difficulty breathing or choking, drooling, shortness of breath or palpitations please go to the emergency room as these could be signs of a medical emergency.  If you are unable to make it to the emergency room please call 911 for further assistance.

## 2023-11-26 NOTE — ED Triage Notes (Addendum)
 Pt presents with complaints of itching that started yesterday 4/18. Pt states she went to the gardening center yesterday afternoon and she began to itch all over (mainly upper extremities). -- no noticeable rash.  Pt reports the itching is worse on scalp, face, neck, ears, hands, and palms. OTC allergy relief taken with no relief. Pt currently denies pain, just voices discomfort. Pt states the itching is worse with movement of extremities.

## 2023-11-26 NOTE — ED Provider Notes (Signed)
 [facility]    CSN: [id number] Arrival date & time: 11/26/23  1313      History   Chief Complaint Chief Complaint  Patient presents with   Pruritis    HPI Kathy Schaefer is a 63 y.o. female.   HPI  She reports diffuse itching that started yesterday after going to the garden center at [employer]  She states she went home and took a shower but this did not provide notable relief  She reports she was already taking loratidine prior to the itching starting  She reports itching is mostly along upper extremities but states she did have some itching from seat belt on the way to UC today     Past Medical History:  Diagnosis Date   Abdominal aortic aneurysm (AAA) (HCC)    ectatic abdominal aorta 2016   Allergy to alpha-gal    Angio-edema    Chronic urticaria 03/26/2023   DDD (degenerative disc disease), lumbar    and cervical per pt   Degenerative disc disease    Depression    pt denies   GERD (gastroesophageal reflux disease)    Head ache    History of hiatal hernia    years ago   Hyperlipidemia    Hypoglycemia    when she doesn"t eat or have protein   Neuropathy    Stroke (HCC) 2018   TIA    Patient Active Problem List   Diagnosis Date Noted   Postherpetic neuralgia 09/07/2023   Allergy to alpha-gal 08/17/2023   Seasonal and perennial allergic rhinitis 03/26/2023   Chronic urticaria 03/26/2023   Abnormal respiratory laboratory results 03/26/2023   Sciatic leg pain 12/26/2021   Osteopenia of multiple sites 09/25/2021   Low back pain 07/08/2021   Anemia 05/20/2021   Ectatic abdominal aorta (HCC) 04/16/2021   Hypertensive disorder 12/22/2020   Angio-edema 12/22/2020   Obesity (BMI 30-39.9) 12/22/2020   Neck pain 10/25/2020   Irritable bowel syndrome with constipation 05/21/2020   Gastroparesis 03/31/2020   Prediabetes 06/04/2019   Chronic pain of right knee 12/01/2018   Diverticulosis 12/01/2018   Sacro-iliac pain 04/30/2018   Polyarthralgia  02/17/2018   Encounter for long-term use of opiate analgesic 02/17/2018   Chronic pain disorder 02/17/2018   Cervical spondylosis without myelopathy 02/17/2018   Arthropathy of cervical facet joint 02/17/2018   Tobacco use disorder 02/03/2018   History of stroke 02/03/2018   Spondylosis of lumbar spine 01/04/2018   Pain of right heel 01/04/2018   DDD (degenerative disc disease), cervical 01/04/2018   Generalized OA 01/04/2018   Familial hypercholesterolemia 01/04/2018   Bone spur of foot 11/07/2017   Torn Achilles tendon 11/07/2017   High triglycerides 11/16/2013   Intervertebral disc protrusion 11/16/2013   Osteoarthritis 03/27/2013   GERD 06/13/2007   PLANTAR FASCIITIS 06/13/2007    Past Surgical History:  Procedure Laterality Date   COLONOSCOPY     MOUTH SURGERY  03/17/2018   removed 8 bottom teeth   TOOTH EXTRACTION N/A 03/17/2018   Procedure: DENTAL EXTRACTIONS TEETH NUMBER TWENTY ONE, TWENTY TWO, TWENTY THREE, TWENTY FOUR, TWENTY FIVE, TWENTY SIX, TWENTY SEVEN, TWENTY EIGHT, ALVEOLOPLASTY;  Surgeon: Ascencion Lava, DDS;  Location: [facility] OR;  Service: Oral Surgery;  Laterality: N/A;    OB History   No obstetric history on file.      Home Medications    Prior to Admission medications   Medication Sig Start Date End Date Taking? Authorizing Provider  hydrOXYzine  (ATARAX ) 25 MG tablet Take 1  tablet (25 mg total) by mouth every 8 (eight) hours as needed for itching. 11/26/23  Yes Girlie Veltri E, PA-C  ARNICA EX Apply 1 application topically daily as needed (for pain).    [provider]  atorvastatin (LIPITOR) 10 MG tablet Take by mouth. 05/27/21   [provider]  CALCIUM PO Take by mouth daily.    [provider]  Cholecalciferol (VITAMIN D3 PO) Take by mouth daily.    [provider]  DULoxetine (CYMBALTA) 30 MG capsule Take 30 mg by mouth daily. 08/25/23   [provider]  EPINEPHrine  (EPIPEN  2-PAK) 0.3 mg/0.3 mL IJ SOAJ injection  Inject 0.3 mg into the muscle as needed for anaphylaxis. 03/24/23   Ardie Kras, FNP  hydrochlorothiazide (HYDRODIURIL) 25 MG tablet Take 25 mg by mouth daily.    [provider]  HYDROcodone -acetaminophen  (NORCO/VICODIN) 5-325 MG tablet Take by mouth. 05/12/22   [provider]  omalizumab  (XOLAIR ) 150 MG/ML prefilled syringe Inject 300 mg into the skin every 28 (twenty-eight) days. 09/27/23   [place] Chuck, MD  omeprazole (PRILOSEC) 20 MG capsule Take 20 mg by mouth daily.    [provider]  oseltamivir  (TAMIFLU ) 75 MG capsule Take 1 capsule (75 mg total) by mouth every 12 (twelve) hours. 09/23/23   Zammit, Joseph, MD  pregabalin (LYRICA) 50 MG capsule Take 50 mg by mouth. 10/08/21   [provider]  tiZANidine (ZANAFLEX) 4 MG tablet TAKE 1 TABLET BY MOUTH THREE TIMES DAILY 05/26/18   [provider]  trolamine salicylate (ASPERCREME) 10 % cream Apply 1 application topically daily as needed for muscle pain.    [provider]    Family History Family History  Problem Relation Age of Onset   Emphysema Mother    Diabetes Mother    Heart disease Mother    Arthritis Mother    Pancreatic cancer Father    Colon cancer Father    ALS Sister    Hyperlipidemia Brother    Esophageal cancer Neg Hx    Rectal cancer Neg Hx    Stomach cancer Neg Hx    Colon polyps Neg Hx    Allergic rhinitis Neg Hx    Angioedema Neg Hx    Asthma Neg Hx    Eczema Neg Hx    Immunodeficiency Neg Hx    Urticaria Neg Hx     Social History Social History   Tobacco Use   Smoking status: Former    Current packs/day: 0.00    Types: Cigarettes    Quit date: 08/10/2019    Years since quitting: 4.2   Smokeless tobacco: Never  Vaping Use   Vaping status: Former  Substance Use Topics   Alcohol use: Not Currently   Drug use: No     Allergies   Alpha-gal, Phenylpropanolamine hcl, Pork allergy, Cephalexin, Guaifenesin, Cyclobenzaprine hcl, and Zyrtec  [cetirizine]   Review of Systems Review of Systems  Respiratory:  Positive for cough (dry cough). Negative for shortness of breath and wheezing.   Skin:        Itching along face, arms, scalp, lower extremities      Physical Exam Triage Vital Signs ED Triage Vitals  Encounter Vitals Group     BP 11/26/23 1346 118/73     Systolic BP Percentile --      Diastolic BP Percentile --      Pulse Rate 11/26/23 1346 77     Resp 11/26/23 1346 18  Temp 11/26/23 1346 98.4 F (36.9 C)     Temp Source 11/26/23 1346 Oral     SpO2 11/26/23 1346 96 %     Weight 11/26/23 1346 204 lb (92.5 kg)     Height 11/26/23 1346 5\' 6"  (1.676 m)     Head Circumference --      Peak Flow --      Pain Score 11/26/23 1413 0     Pain Loc --      Pain Education --      Exclude from Growth Chart --    No data found.  Updated Vital Signs BP 118/73 (BP Location: Right Arm)   Pulse 77   Temp 98.4 F (36.9 C) (Oral)   Resp 18   Ht 5\' 6"  (1.676 m)   Wt 204 lb (92.5 kg)   SpO2 96%   BMI 32.93 kg/m   Visual Acuity Right Eye Distance:   Left Eye Distance:   Bilateral Distance:    Right Eye Near:   Left Eye Near:    Bilateral Near:     Physical Exam Vitals reviewed.  Constitutional:      General: She is awake.     Appearance: Normal appearance. She is well-developed and well-groomed.  HENT:     Head: Normocephalic and atraumatic.  Pulmonary:     Effort: Pulmonary effort is normal.     Breath sounds: Normal breath sounds.  Skin:    General: Skin is warm and dry.     Findings: No abrasion, ecchymosis, erythema, lesion or rash.     Comments: No evidence of rash on upper and lower extremities or exposed areas of face, neck, chest or back.  Neurological:     General: No focal deficit present.     Mental Status: She is alert and oriented to person, place, and time.  Psychiatric:        Mood and Affect: Mood normal.        Behavior: Behavior normal. Behavior is cooperative.        Thought  Content: Thought content normal.        Judgment: Judgment normal.      UC Treatments / Results  Labs (all labs ordered are listed, but only abnormal results are displayed) Labs Reviewed - No data to display  EKG   Radiology No results found.  Procedures Procedures (including critical care time)  Medications Ordered in UC Medications  triamcinolone  acetonide (KENALOG -40) injection 40 mg (40 mg Intramuscular Given 11/26/23 1542)    Initial Impression / Assessment and Plan / UC Course  I have reviewed the triage vital signs and the nursing notes.  Pertinent labs & imaging results that were available during my care of the patient were reviewed by me and considered in my medical decision making (see chart for details).      Final Clinical Impressions(s) / UC Diagnoses   Final diagnoses:  Itching  Pruritus   Patient presents today with concerns for pruritus along her extremities, face and neck since yesterday.  She reports that she went to the garden center at [facility] home-improvement and after that excursion and started to experience the severe itching.  She reports that she already takes loratadine for allergies prior to this and is due soon for her Xolair  injection.  Physical exam does not show notable signs of rash or outbreak.  No obvious signs of respiratory distress and she appears to be tolerating and managing her secretions without issue.  Given the  patient's reported severity of itching we will provide Kenalog  40 mg injection here in clinic.  Recommend continued use of loratadine and injecting Xolair  in about 5 days if appropriate.  Will provide prescription for hydroxyzine  25 mg to be used for further itching as needed.  Reviewed with patient that this can be sedating so she should not use if she is running alert or drive.  ED return precautions were also reviewed and provided after visit summary.  Follow-up as needed    Discharge Instructions      You were seen  today for concerns for generalized itching At this time I suspect you likely come into contact with an allergen that has caused you to have the itching sensation all over. You did not appear to have a rash and you are not having any trouble breathing or swelling in your throat or mouth which would constitute a severe allergic reaction.  At this time we have provided you with a Kenalog  40 mg injection to assist with the itching and discomfort. I recommend that you continue taking your loratadine daily as this can help with allergic reactions and itching as well.  I have also sent a medication for you to take called hydroxyzine .  You can take this up to every 8 hours as needed for severe itching symptoms.  This medication can be sedating so please do not take it if you need to remain alert or drive. If at any point you start to develop swelling of the tongue or throat, difficulty breathing or choking, drooling, shortness of breath or palpitations please go to the emergency room as these could be signs of a medical emergency.  If you are unable to make it to the emergency room please call 911 for further assistance.     ED Prescriptions     Medication Sig Dispense Auth. Provider   hydrOXYzine  (ATARAX ) 25 MG tablet Take 1 tablet (25 mg total) by mouth every 8 (eight) hours as needed for itching. 12 tablet Kimisha Eunice E, PA-C      PDMP not reviewed this encounter.   Jerona Mooring, PA-C 11/27/23 2841

## 2023-12-07 ENCOUNTER — Ambulatory Visit: Payer: Medicare Other | Admitting: Sports Medicine

## 2023-12-15 ENCOUNTER — Ambulatory Visit: Payer: Medicare Other | Admitting: Allergy & Immunology

## 2023-12-19 ENCOUNTER — Ambulatory Visit (INDEPENDENT_AMBULATORY_CARE_PROVIDER_SITE_OTHER): Admitting: Allergy & Immunology

## 2023-12-19 ENCOUNTER — Other Ambulatory Visit: Payer: Self-pay

## 2023-12-19 ENCOUNTER — Encounter: Payer: Self-pay | Admitting: Allergy & Immunology

## 2023-12-19 VITALS — BP 126/82 | HR 69 | Temp 97.9°F | Resp 18 | Ht 65.75 in | Wt 206.8 lb

## 2023-12-19 DIAGNOSIS — L508 Other urticaria: Secondary | ICD-10-CM | POA: Diagnosis not present

## 2023-12-19 DIAGNOSIS — J3089 Other allergic rhinitis: Secondary | ICD-10-CM | POA: Diagnosis not present

## 2023-12-19 DIAGNOSIS — Z91014 Allergy to mammalian meats: Secondary | ICD-10-CM | POA: Diagnosis not present

## 2023-12-19 DIAGNOSIS — J302 Other seasonal allergic rhinitis: Secondary | ICD-10-CM | POA: Diagnosis not present

## 2023-12-19 MED ORDER — HYDROXYZINE HCL 25 MG PO TABS: 25.0000 mg | ORAL_TABLET | Freq: Three times a day (TID) | ORAL | 5 refills | Status: DC | PRN

## 2023-12-19 NOTE — Patient Instructions (Addendum)
 1. Angioedema with urticaria - Let's wean the injections to every 6 weeks and see if this works fine. - We are not going to change the prescription on OUR end yet since we are not sure that this dosing stretch is going to work.  - Call us  with breakthrough symptoms and questions.  2. Seasonal and perennial allergic rhinitis - It seems that your symptoms are under good control. - We would consider allergy shots for long term control. - We could mix the shots based on your blood work from when we first met in 2022.  - I would   3. Return in about 6 months (around 06/20/2024). You can have the follow up appointment with Dr. Idolina Maker or a Nurse Practicioner (our Nurse Practitioners are excellent and always have Physician oversight!).    Please inform us  of any Emergency Department visits, hospitalizations, or changes in symptoms. Call us  before going to the ED for breathing or allergy symptoms since we might be able to fit you in for a sick visit. Feel free to contact us  anytime with any questions, problems, or concerns.  It was a pleasure to see you again today!  Websites that have reliable patient information: 1. American Academy of Asthma, Allergy, and Immunology: www.aaaai.org 2. Food Allergy Research and Education (FARE): foodallergy.org 3. Mothers of Asthmatics: http://www.asthmacommunitynetwork.org 4. American College of Allergy, Asthma, and Immunology: www.acaai.org      "Like" us  on Facebook and Instagram for our latest updates!      A healthy democracy works best when [employer] participate! Make sure you are registered to vote! If you have moved or changed any of your contact information, you will need to get this updated before voting! Scan the QR codes below to learn more!      Allergy Shots  Allergies are the result of a chain reaction that starts in the immune system. Your immune system controls how your body defends itself. For instance, if you have an allergy to  pollen, your immune system identifies pollen as an invader or allergen. Your immune system overreacts by producing antibodies called Immunoglobulin E (IgE). These antibodies travel to cells that release chemicals, causing an allergic reaction.  The concept behind allergy immunotherapy, whether it is received in the form of shots or tablets, is that the immune system can be desensitized to specific allergens that trigger allergy symptoms. Although it requires time and patience, the payback can be long-term relief. Allergy injections contain a dilute solution of those substances that you are allergic to based upon your skin testing and allergy history.   How Do Allergy Shots Work?  Allergy shots work much like a vaccine. Your body responds to injected amounts of a particular allergen given in increasing doses, eventually developing a resistance and tolerance to it. Allergy shots can lead to decreased, minimal or no allergy symptoms.  There generally are two phases: build-up and maintenance. Build-up often ranges from three to six months and involves receiving injections with increasing amounts of the allergens. The shots are typically given once or twice a week, though more rapid build-up schedules are sometimes used.  The maintenance phase begins when the most effective dose is reached. This dose is different for each person, depending on how allergic you are and your response to the build-up injections. Once the maintenance dose is reached, there are longer periods between injections, typically two to four weeks.  Occasionally doctors give cortisone-type shots that can temporarily reduce allergy symptoms. These types of shots  are different and should not be confused with allergy immunotherapy shots.  Who Can Be Treated with Allergy Shots?  Allergy shots may be a good treatment approach for people with allergic rhinitis (hay fever), allergic asthma, conjunctivitis (eye allergy) or stinging insect  allergy.   Before deciding to begin allergy shots, you should consider:   The length of allergy season and the severity of your symptoms  Whether medications and/or changes to your environment can control your symptoms  Your desire to avoid long-term medication use  Time: allergy immunotherapy requires a major time commitment  Cost: may vary depending on your insurance coverage  Allergy shots for children age 65 and older are effective and often well tolerated. They might prevent the onset of new allergen sensitivities or the progression to asthma.  Allergy shots are not started on patients who are pregnant but can be continued on patients who become pregnant while receiving them. In some patients with other medical conditions or who take certain common medications, allergy shots may be of risk. It is important to mention other medications you talk to your allergist.   What are the two types of build-ups offered:   RUSH or Rapid Desensitization -- one day of injections lasting from 8:30-4:30pm, injections every 1 hour.  Approximately half of the build-up process is completed in that one day.  The following week, normal build-up is resumed, and this entails ~16 visits either weekly or twice weekly, until reaching your "maintenance dose" which is continued weekly until eventually getting spaced out to every month for a duration of 3 to 5 years. The regular build-up appointments are nurse visits where the injections are administered, followed by required monitoring for 30 minutes.    Traditional build-up -- weekly visits for 6 -12 months until reaching "maintenance dose", then continue weekly until eventually spacing out to every 4 weeks as above. At these appointments, the injections are administered, followed by required monitoring for 30 minutes.     Either way is acceptable, and both are equally effective. With the rush protocol, the advantage is that less time is spent here for injections  overall AND you would also reach maintenance dosing faster (which is when the clinical benefit starts to become more apparent). Not everyone is a candidate for rapid desensitization.   IF we proceed with the RUSH protocol, there are premedications which must be taken the day before and the day after the rush only (this includes antihistamines, steroids, and Singulair).  After the rush day, no prednisone  or Singulair is required, and we just recommend antihistamines taken on your injection day.  What Is An Estimate of the Costs?  If you are interested in starting allergy injections, please check with your insurance company about your coverage for both allergy vial sets and allergy injections.  Please do so prior to making the appointment to start injections.  The following are CPT codes to give to your insurance company. These are the amounts we BILL to the insurance company, but the amount YOU WILL PAY and WE RECEIVE IS SUBSTANTIALLY LESS and depends on the contracts we have with different insurance companies.   Amount Billed to Insurance One allergy vial set  CPT 95165   $ 1200     Two allergy vial set  CPT 95165   $ 2400     Three allergy vial set  CPT 95165   $ 3600     One injection   CPT 95115   $ 35  Two  injections   CPT 95117   $ 40 RUSH (Rapid Desensitization) CPT 95180 x 8 hours $500/hour  Regarding the allergy injections, your co-pay may or may not apply with each injection, so please confirm this with your insurance company. When you start allergy injections, 1 or 2 sets of vials are made based on your allergies.  Not all patients can be on one set of vials. A set of vials lasts 6 months to a year depending on how quickly you can proceed with your build-up of your allergy injections. Vials are personalized for each patient depending on their specific allergens.  How often are allergy injection given during the build-up period?   Injections are given at least weekly during the build-up  period until your maintenance dose is achieved. Per the doctor's discretion, you may have the option of getting allergy injections two times per week during the build-up period. However, there must be at least 48 hours between injections. The build-up period is usually completed within 6-12 months depending on your ability to schedule injections and for adjustments for reactions. When maintenance dose is reached, your injection schedule is gradually changed to every two weeks and later to every three weeks. Injections will then continue every 4 weeks. Usually, injections are continued for a total of 3-5 years.   When Will I Feel Better?  Some may experience decreased allergy symptoms during the build-up phase. For others, it may take as long as 12 months on the maintenance dose. If there is no improvement after a year of maintenance, your allergist will discuss other treatment options with you.  If you aren't responding to allergy shots, it may be because there is not enough dose of the allergen in your vaccine or there are missing allergens that were not identified during your allergy testing. Other reasons could be that there are high levels of the allergen in your environment or major exposure to non-allergic triggers like tobacco smoke.  What Is the Length of Treatment?  Once the maintenance dose is reached, allergy shots are generally continued for three to five years. The decision to stop should be discussed with your allergist at that time. Some people may experience a permanent reduction of allergy symptoms. Others may relapse and a longer course of allergy shots can be considered.  What Are the Possible Reactions?  The two types of adverse reactions that can occur with allergy shots are local and systemic. Common local reactions include very mild redness and swelling at the injection site, which can happen immediately or several hours after. Report a delayed reaction from your last injection.  These include arm swelling or runny nose, watery eyes or cough that occurs within 12-24 hours after injection. A systemic reaction, which is less common, affects the entire body or a particular body system. They are usually mild and typically respond quickly to medications. Signs include increased allergy symptoms such as sneezing, a stuffy nose or hives.   Rarely, a serious systemic reaction called anaphylaxis can develop. Symptoms include swelling in the throat, wheezing, a feeling of tightness in the chest, nausea or dizziness. Most serious systemic reactions develop within 30 minutes of allergy shots. This is why it is strongly recommended you wait in your doctor's office for 30 minutes after your injections. Your allergist is trained to watch for reactions, and his or her staff is trained and equipped with the proper medications to identify and treat them.   Report to the nurse immediately if you experience any  of the following symptoms: swelling, itching or redness of the skin, hives, watery eyes/nose, breathing difficulty, excessive sneezing, coughing, stomach pain, diarrhea, or light headedness. These symptoms may occur within 15-20 minutes after injection and may require medication.   Who Should Administer Allergy Shots?  The preferred location for receiving shots is your prescribing allergist's office. Injections can sometimes be given at another facility where the physician and staff are trained to recognize and treat reactions, and have received instructions by your prescribing allergist.  What if I am late for an injection?   Injection dose will be adjusted depending upon how many days or weeks you are late for your injection.   What if I am sick?   Please report any illness to the nurse before receiving injections. She may adjust your dose or postpone injections depending on your symptoms. If you have fever, flu, sinus infection or chest congestion it is best to postpone allergy  injections until you are better. Never get an allergy injection if your asthma is causing you problems. If your symptoms persist, seek out medical care to get your health problem under control.  What If I am or Become Pregnant:  Women that become pregnant should schedule an appointment with [facility] before receiving any further allergy injections.

## 2023-12-19 NOTE — Progress Notes (Signed)
 FOLLOW UP  Date of Service/Encounter:  12/19/23   Assessment:   Angioedema with urticaria - doing very well on Xolair  (attempting to wean today)   Allergic rhinoconjunctivitis   Alpha-gal allergy  Esophageal spasms - that worsen when she weaned her Xolair  (denied airway involvement)    Plan/Recommendations:   1. Angioedema with urticaria - Let's wean the injections to every 6 weeks and see if this works fine. - We are not going to change the prescription on OUR end yet since we are not sure that this dosing stretch is going to work.  - Call us  with breakthrough symptoms and questions.  2. Seasonal and perennial allergic rhinitis - It seems that your symptoms are under good control. - We would consider allergy shots for long term control. - We could mix the shots based on your blood work from when we first met in 2022.  - I would   3. Return in about 6 months (around 06/20/2024). You can have the follow up appointment with Dr. Idolina Maker or a Nurse Practicioner (our Nurse Practitioners are excellent and always have Physician oversight!).    Subjective:   Kathy Schaefer is a 63 y.o. female presenting today for follow up of  Chief Complaint  Patient presents with   Angioedema    She is doing pretty good. Has an allergic reaction, seen UC in April and they seemed to think it was an autoimmune issue. Lots and Lots of itching on face and neck. Occasional bouts of itching.     Kathy Schaefer has a history of the following: Patient Active Problem List   Diagnosis Date Noted   Postherpetic neuralgia 09/07/2023   Allergy to alpha-gal 08/17/2023   Seasonal and perennial allergic rhinitis 03/26/2023   Chronic urticaria 03/26/2023   Abnormal respiratory laboratory results 03/26/2023   Sciatic leg pain 12/26/2021   Osteopenia of multiple sites 09/25/2021   Low back pain 07/08/2021   Anemia 05/20/2021   Ectatic abdominal aorta (HCC) 04/16/2021   Hypertensive disorder  12/22/2020   Angio-edema 12/22/2020   Obesity (BMI 30-39.9) 12/22/2020   Neck pain 10/25/2020   Irritable bowel syndrome with constipation 05/21/2020   Gastroparesis 03/31/2020   Prediabetes 06/04/2019   Chronic pain of right knee 12/01/2018   Diverticulosis 12/01/2018   Sacro-iliac pain 04/30/2018   Polyarthralgia 02/17/2018   Encounter for long-term use of opiate analgesic 02/17/2018   Chronic pain disorder 02/17/2018   Cervical spondylosis without myelopathy 02/17/2018   Arthropathy of cervical facet joint 02/17/2018   Tobacco use disorder 02/03/2018   History of stroke 02/03/2018   Spondylosis of lumbar spine 01/04/2018   Pain of right heel 01/04/2018   DDD (degenerative disc disease), cervical 01/04/2018   Generalized OA 01/04/2018   Familial hypercholesterolemia 01/04/2018   Bone spur of foot 11/07/2017   Torn Achilles tendon 11/07/2017   High triglycerides 11/16/2013   Intervertebral disc protrusion 11/16/2013   Osteoarthritis 03/27/2013   GERD 06/13/2007   PLANTAR FASCIITIS 06/13/2007    History obtained from: chart review and patient.  Discussed the use of AI scribe software for clinical note transcription with the patient and/or guardian, who gave verbal consent to proceed.  Kathy Schaefer is a 63 y.o. female presenting for a follow up visit.  She was last seen in August 2024 by Marinus Sic, one of our nurse practitioners.  At that time, she was continued on Xolair  for her angioedema and urticaria.  For her rhinitis, she was continued on avoidance measures.  She continue to avoid alpha gal.  Since last visit, she has done well.  She experiences esophageal swelling, described as the worst symptom when attempting to wean off Xolair . The swelling leads to spasms and significant pain, making swallowing difficult. She manages these symptoms by sipping hot tea and staying upright, as lying down exacerbates the pain. She attempted to wean off Xolair  about six weeks ago but had to  resume it due to the return of symptoms. She administers Xolair  once a month and has not used the EpiPen  during these episodes.  She has a history of allergies, including reactions to red meat, which she has been avoiding. She is uncertain if Xolair  is masking her food allergies, as she has been able to consume small amounts without reaction. She was diagnosed with these allergies in 2017 at [facility].  She is not really interested in eating mammalian meat again.  She reports severe itching, particularly of the ears, scalp, eyelids, mouth, and nose, which she attributes to environmental allergens. The itching is intense and unrelieved by Allegra or Zyrtec, but hydroxyzine  provided relief within a day. She has been living in a new location since January, which has pine straw and a large rosebush nearby, and her dog also experiences itching, suggesting a possible environmental trigger.  This itching started last month.  She has been treating with hydroxyzine  which seems to help with just 1 dose.  She is not using hydroxyzine  regularly, but she does need a refill.  She will receive 12 tablets from urgent care.  She has a history of chronic pain and underwent a microdiscectomy on September 20, 2023, which alleviated her sciatic pain. She continues to experience other pain issues and is on hydrocodone  for pain management, having previously used tramadol . She is hesitant about further surgeries despite recommendations for additional spinal procedures.   Otherwise, there have been no changes to her past medical history, surgical history, family history, or social history.    Review of systems otherwise negative other than that mentioned in the HPI.    Objective:   Blood pressure 126/82, pulse 69, temperature 97.9 F (36.6 C), temperature source Temporal, resp. rate 18, height 5' 5.75" (1.67 m), weight 206 lb 12.8 oz (93.8 kg), SpO2 99%. Body mass index is 33.63 kg/m.    Physical Exam Vitals  reviewed.  Constitutional:      Appearance: She is well-developed.     Comments: Very pleasant. Smiling.   HENT:     Head: Normocephalic and atraumatic.     Right Ear: Tympanic membrane, ear canal and external ear normal. No drainage, swelling or tenderness. Tympanic membrane is not injected, scarred, erythematous, retracted or bulging.     Left Ear: Tympanic membrane, ear canal and external ear normal. No drainage, swelling or tenderness. Tympanic membrane is not injected, scarred, erythematous, retracted or bulging.     Nose: No nasal deformity, septal deviation, mucosal edema or rhinorrhea.     Right Turbinates: Enlarged, swollen and pale.     Left Turbinates: Enlarged, swollen and pale.     Right Sinus: No maxillary sinus tenderness or frontal sinus tenderness.     Left Sinus: No maxillary sinus tenderness or frontal sinus tenderness.     Comments: Scant clear rhinorrhea.     Mouth/Throat:     Mouth: Mucous membranes are not pale and not dry.     Pharynx: Uvula midline.  Eyes:     General: Lids are normal. Allergic shiner present.  Right eye: No discharge.        Left eye: No discharge.     Conjunctiva/sclera: Conjunctivae normal.     Right eye: Right conjunctiva is not injected. No chemosis.    Left eye: Left conjunctiva is not injected. No chemosis.    Pupils: Pupils are equal, round, and reactive to light.  Cardiovascular:     Rate and Rhythm: Normal rate and regular rhythm.     Heart sounds: Normal heart sounds.  Pulmonary:     Effort: Pulmonary effort is normal. No tachypnea, accessory muscle usage or respiratory distress.     Breath sounds: Normal breath sounds. No wheezing, rhonchi or rales.     Comments: Moving air well in all lung fields.  No increased work of breathing. Chest:     Chest wall: No tenderness.  Lymphadenopathy:     Head:     Right side of head: No submandibular, tonsillar or occipital adenopathy.     Left side of head: No submandibular, tonsillar  or occipital adenopathy.     Cervical: No cervical adenopathy.  Skin:    General: Skin is warm.     Capillary Refill: Capillary refill takes less than 2 seconds.     Coloration: Skin is not pale.     Findings: No abrasion, erythema, petechiae or rash. Rash is not papular, urticarial or vesicular.     Comments: Resolving urticarial lesion on the right shoulder.  No angioedema appreciated today.  Neurological:     Mental Status: She is alert.  Psychiatric:        Behavior: Behavior is cooperative.      Diagnostic studies: none       Drexel Gentles, MD  [facility] 

## 2024-03-27 ENCOUNTER — Ambulatory Visit: Admitting: Allergy & Immunology

## 2024-04-10 ENCOUNTER — Other Ambulatory Visit: Payer: Self-pay

## 2024-04-10 ENCOUNTER — Ambulatory Visit (INDEPENDENT_AMBULATORY_CARE_PROVIDER_SITE_OTHER): Admitting: Allergy & Immunology

## 2024-04-10 ENCOUNTER — Encounter: Payer: Self-pay | Admitting: Allergy & Immunology

## 2024-04-10 VITALS — BP 116/84 | HR 66 | Temp 98.0°F | Resp 14 | Ht 66.25 in | Wt 210.4 lb

## 2024-04-10 DIAGNOSIS — T783XXD Angioneurotic edema, subsequent encounter: Secondary | ICD-10-CM

## 2024-04-10 DIAGNOSIS — Z91014 Allergy to mammalian meats: Secondary | ICD-10-CM

## 2024-04-10 DIAGNOSIS — J302 Other seasonal allergic rhinitis: Secondary | ICD-10-CM | POA: Diagnosis not present

## 2024-04-10 DIAGNOSIS — L508 Other urticaria: Secondary | ICD-10-CM

## 2024-04-10 DIAGNOSIS — J3089 Other allergic rhinitis: Secondary | ICD-10-CM

## 2024-04-10 NOTE — Progress Notes (Signed)
 FOLLOW UP  Date of Service/Encounter:  04/10/24   Assessment:   Angioedema with urticaria - doing very well on Xolair  (failed attempts to wean)   Allergic rhinoconjunctivitis   Alpha-gal allergy   Esophageal spasms - that worsen when she weaned her Xolair  (denied airway involvement)  Abdominal pain - occurring in the setting of the esophageal spasms    Plan/Recommendations:   1. Angioedema with urticaria - Continue with the injections every 4-5 weeks since you have remained stable. - Xolair  IS NOW approved for food allergy management, so this should protect you from reactions from exposure to the food. - This should protect you from a reaction from future reactions. - We are going to repeat the hereditary angioedema labs (we did these back in 2022, but I want to repeat this to make sure).  - Clearly the Xolair  has not completely eradicated these swelling episodes, so I want to be thorough that we are not missing something.  - We may consider adding on [place] to see if this might help decrease the frequency of these reactions.  - We will see what the HAE labs shows.   2. Seasonal and perennial allergic rhinitis - It seems that your symptoms are under good control. - We would consider allergy shots for long term control. - We could mix the shots based on your blood work from when we first met in 2022.   3. Return in about 6 months (around 10/08/2024). You can have the follow up appointment with Dr. Iva or a Nurse Practicioner (our Nurse Practitioners are excellent and always have Physician oversight!).   Subjective:   Kathy Schaefer is a 63 y.o. female presenting today for follow up of  Chief Complaint  Patient presents with   Follow-up    She wants to talk about autoimmune     Kathy Schaefer has a history of the following: Patient Active Problem List   Diagnosis Date Noted   Postherpetic neuralgia 09/07/2023   Allergy to alpha-gal 08/17/2023   Seasonal and  perennial allergic rhinitis 03/26/2023   Chronic urticaria 03/26/2023   Abnormal respiratory laboratory results 03/26/2023   Sciatic leg pain 12/26/2021   Osteopenia of multiple sites 09/25/2021   Low back pain 07/08/2021   Anemia 05/20/2021   Ectatic abdominal aorta (HCC) 04/16/2021   Hypertensive disorder 12/22/2020   Angio-edema 12/22/2020   Obesity (BMI 30-39.9) 12/22/2020   Neck pain 10/25/2020   Irritable bowel syndrome with constipation 05/21/2020   Gastroparesis 03/31/2020   Prediabetes 06/04/2019   Chronic pain of right knee 12/01/2018   Diverticulosis 12/01/2018   Sacro-iliac pain 04/30/2018   Polyarthralgia 02/17/2018   Encounter for long-term use of opiate analgesic 02/17/2018   Chronic pain disorder 02/17/2018   Cervical spondylosis without myelopathy 02/17/2018   Arthropathy of cervical facet joint 02/17/2018   Tobacco use disorder 02/03/2018   History of stroke 02/03/2018   Spondylosis of lumbar spine 01/04/2018   Pain of right heel 01/04/2018   DDD (degenerative disc disease), cervical 01/04/2018   Generalized OA 01/04/2018   Familial hypercholesterolemia 01/04/2018   Bone spur of foot 11/07/2017   Torn Achilles tendon 11/07/2017   High triglycerides 11/16/2013   Intervertebral disc protrusion 11/16/2013   Osteoarthritis 03/27/2013   GERD 06/13/2007   PLANTAR FASCIITIS 06/13/2007    History obtained from: chart review and patient.  Discussed the use of AI scribe software for clinical note transcription with the patient and/or guardian, who gave verbal consent to proceed.  Kathy Schaefer is a 63 y.o. female presenting for a follow up visit.  She was last seen in May 2025.  At that time, we weaned her Xolair  to every 6 weeks.  For her rhinitis, we continued with symptomatic treatment with over-the-counter medications.  Since last visit, she has done well.   She experiences recurrent swelling episodes primarily affecting her esophagus, causing spasms and severe  pain. She reports that the episodes can occur as frequently as every four to five weeks, but the timing is variable. She describes that sometimes the swelling starts with a sensation in her lip and may progress to her esophagus, causing spasms and pain. She is currently on Xolair  injections once a month, which may provide some protection against reactions, but she still experiences episodes of swelling.  She has a history of idiopathic angioedema, with episodes characterized by swelling of the lips, tongue, and esophagus, sometimes accompanied by stomach pain. These episodes have occurred about four times in the last few months, even while on Xolair . A recent severe episode began with eye irritation and progressed to facial prickling, lip swelling, and gastrointestinal symptoms. Hydroxyzine  helps alleviate symptoms but causes significant sedation, while loratadine provides some relief without sedation.  These have seemed to have decreased since starting the Xolair  nearly 3 years ago, but recently they have begun to be more frequent. We previously did testing for HAE and this was negative. This was back in 2022. There is no correlation with any foods at all. She does avoid the red meats due to her alpha gal was negative.   She has a history of arthritis affecting multiple joints, including her spine, knee, and hands. She has been evaluated by a rheumatologist and is currently taking diclofenac , which provides significant relief. She questions the lack of inflammation markers despite her symptoms and the relief she gets from anti-inflammatory medication.  She has a torn meniscus in her knee, which occurred after a fall. She received an injection for the meniscus tear, but the outcome is uncertain due to severe arthritis in the knee.    Food Allergy Symptom History: She has a history of alpha-gal syndrome and has been cautious with red meat consumption. She recently tested her tolerance by consuming bacon and  did not experience a reaction. She carries an EpiPen  for emergencies and notes that her reactions, when they occur, typically happen three hours after exposure.   Skin Symptom History: She is largely doing better on the every 4-5 weeks.   She has a history of chronic pain and underwent a microdiscectomy on September 20, 2023, which alleviated her sciatic pain. She continues to experience other pain issues and is on hydrocodone  for pain management, having previously used tramadol . She is hesitant about further surgeries despite recommendations for additional spinal procedures.     Otherwise, there have been no changes to her past medical history, surgical history, family history, or social history.    Review of systems otherwise negative other than that mentioned in the HPI.    Objective:   Blood pressure 116/84, pulse 66, temperature 98 F (36.7 C), temperature source Temporal, resp. rate 14, height 5' 6.25 (1.683 m), weight 210 lb 6.4 oz (95.4 kg), SpO2 97%. Body mass index is 33.7 kg/m.    Physical Exam Vitals reviewed.  Constitutional:      Appearance: She is well-developed.     Comments: Very pleasant. Smiling. Cooperative with the exam.   HENT:     Head: Normocephalic and atraumatic.  Right Ear: Tympanic membrane, ear canal and external ear normal. No drainage, swelling or tenderness. Tympanic membrane is not injected, scarred, erythematous, retracted or bulging.     Left Ear: Tympanic membrane, ear canal and external ear normal. No drainage, swelling or tenderness. Tympanic membrane is not injected, scarred, erythematous, retracted or bulging.     Nose: No nasal deformity, septal deviation, mucosal edema or rhinorrhea.     Right Turbinates: Enlarged, swollen and pale.     Left Turbinates: Enlarged, swollen and pale.     Right Sinus: No maxillary sinus tenderness or frontal sinus tenderness.     Left Sinus: No maxillary sinus tenderness or frontal sinus tenderness.      Comments: Scant clear rhinorrhea. No polyps.     Mouth/Throat:     Mouth: Mucous membranes are not pale and not dry.     Pharynx: Uvula midline.  Eyes:     General: Lids are normal. Allergic shiner present.        Right eye: No discharge.        Left eye: No discharge.     Conjunctiva/sclera: Conjunctivae normal.     Right eye: Right conjunctiva is not injected. No chemosis.    Left eye: Left conjunctiva is not injected. No chemosis.    Pupils: Pupils are equal, round, and reactive to light.  Cardiovascular:     Rate and Rhythm: Normal rate and regular rhythm.     Heart sounds: Normal heart sounds.  Pulmonary:     Effort: Pulmonary effort is normal. No tachypnea, accessory muscle usage or respiratory distress.     Breath sounds: Normal breath sounds. No wheezing, rhonchi or rales.     Comments: Moving air well in all lung fields.  No increased work of breathing. Chest:     Chest wall: No tenderness.  Lymphadenopathy:     Head:     Right side of head: No submandibular, tonsillar or occipital adenopathy.     Left side of head: No submandibular, tonsillar or occipital adenopathy.     Cervical: No cervical adenopathy.  Skin:    General: Skin is warm.     Capillary Refill: Capillary refill takes less than 2 seconds.     Coloration: Skin is not pale.     Findings: No abrasion, erythema, petechiae or rash. Rash is not papular, urticarial or vesicular.     Comments: Resolving urticarial lesion on the right shoulder.  No angioedema appreciated today.  Neurological:     Mental Status: She is alert.  Psychiatric:        Behavior: Behavior is cooperative.      Diagnostic studies: labs sent instead        Kathy Shaggy, MD  [facility] 

## 2024-04-10 NOTE — Patient Instructions (Addendum)
 1. Angioedema with urticaria - Continue with the injections every 4-5 weeks since you have remained stable. - Xolair  IS NOW approved for food allergy management, so this should protect you from reactions from exposure to the food. - This should protect you from a reaction from future reactions. - We are going to repeat the hereditary angioedema labs (we did these back in 2022, but I want to repeat this to make sure).  - Clearly the Xolair  has not completely eradicated these swelling episodes, so I want to be thorough that we are not missing something.   2. Seasonal and perennial allergic rhinitis - It seems that your symptoms are under good control. - We would consider allergy shots for long term control. - We could mix the shots based on your blood work from when we first met in 2022.   3. Return in about 6 months (around 10/08/2024). You can have the follow up appointment with Dr. Iva or a Nurse Practicioner (our Nurse Practitioners are excellent and always have Physician oversight!).    Please inform us  of any Emergency Department visits, hospitalizations, or changes in symptoms. Call us  before going to the ED for breathing or allergy symptoms since we might be able to fit you in for a sick visit. Feel free to contact us  anytime with any questions, problems, or concerns.  It was a pleasure to see you again today!  Websites that have reliable patient information: 1. American Academy of Asthma, Allergy, and Immunology: www.aaaai.org 2. Food Allergy Research and Education (FARE): foodallergy.org 3. Mothers of Asthmatics: http://www.asthmacommunitynetwork.org 4. American College of Allergy, Asthma, and Immunology: www.acaai.org      "Like" us  on Facebook and Instagram for our latest updates!      A healthy democracy works best when [employer] participate! Make sure you are registered to vote! If you have moved or changed any of your contact information, you will need to get this  updated before voting! Scan the QR codes below to learn more!

## 2024-04-15 LAB — C1 ESTERASE INHIBITOR: C1INH SerPl-mCnc: 30 mg/dL (ref 21–39)

## 2024-04-15 LAB — C3 AND C4
Complement C3, Serum: 178 mg/dL — ABNORMAL HIGH (ref 82–167)
Complement C4, Serum: 19 mg/dL (ref 12–38)

## 2024-04-15 LAB — TRYPTASE: Tryptase: 5.5 ug/L (ref 2.2–13.2)

## 2024-04-15 LAB — COMPLEMENT COMPONENT C1Q: Complement C1Q: 15 mg/dL (ref 10.3–20.5)

## 2024-04-15 LAB — C1 ESTERASE INHIBITOR, FUNCTIONAL: C1INH Functional/C1INH Total MFr SerPl: >105 %{normal}

## 2024-04-21 ENCOUNTER — Ambulatory Visit: Payer: Self-pay | Admitting: Allergy & Immunology

## 2024-05-07 ENCOUNTER — Other Ambulatory Visit: Payer: Self-pay | Admitting: Family Medicine

## 2024-05-07 DIAGNOSIS — J3089 Other allergic rhinitis: Secondary | ICD-10-CM

## 2024-05-13 ENCOUNTER — Emergency Department (HOSPITAL_BASED_OUTPATIENT_CLINIC_OR_DEPARTMENT_OTHER)
Admission: EM | Admit: 2024-05-13 | Discharge: 2024-05-13 | Disposition: A | Discharge status: Home / Self Care | Attending: Emergency Medicine | Admitting: Emergency Medicine

## 2024-05-13 DIAGNOSIS — R112 Nausea with vomiting, unspecified: Secondary | ICD-10-CM | POA: Diagnosis present

## 2024-05-13 DIAGNOSIS — R197 Diarrhea, unspecified: Secondary | ICD-10-CM | POA: Diagnosis not present

## 2024-05-13 LAB — COMPREHENSIVE METABOLIC PANEL WITH GFR
ALT: 13 U/L (ref 0–44)
AST: 26 U/L (ref 15–41)
Albumin: 4.4 g/dL (ref 3.5–5.0)
Alkaline Phosphatase: 179 U/L — ABNORMAL HIGH (ref 38–126)
Anion gap: 9 (ref 5–15)
BUN: 15 mg/dL (ref 8–23)
CO2: 31 mmol/L (ref 22–32)
Calcium: 9.9 mg/dL (ref 8.9–10.3)
Chloride: 101 mmol/L (ref 98–111)
Creatinine, Ser: 0.77 mg/dL (ref 0.44–1.00)
GFR, Estimated: >60 mL/min (ref >60)
Glucose, Bld: 113 mg/dL — ABNORMAL HIGH (ref 70–99)
Potassium: 3.7 mmol/L (ref 3.5–5.1)
Sodium: 141 mmol/L (ref 135–145)
Total Bilirubin: 0.3 mg/dL (ref 0.0–1.2)
Total Protein: 7.5 g/dL (ref 6.5–8.1)

## 2024-05-13 LAB — URINALYSIS, ROUTINE W REFLEX MICROSCOPIC
Bilirubin Urine: NEGATIVE
Glucose, UA: NEGATIVE mg/dL
Hgb urine dipstick: NEGATIVE
Ketones, ur: NEGATIVE mg/dL
Leukocytes,Ua: NEGATIVE
Nitrite: NEGATIVE
Protein, ur: NEGATIVE mg/dL
Specific Gravity, Urine: 1.019 (ref 1.005–1.030)
pH: 5.5 (ref 5.0–8.0)

## 2024-05-13 LAB — CBC
HCT: 38.4 % (ref 36.0–46.0)
Hemoglobin: 12.5 g/dL (ref 12.0–15.0)
MCH: 28.5 pg (ref 26.0–34.0)
MCHC: 32.6 g/dL (ref 30.0–36.0)
MCV: 87.5 fL (ref 80.0–100.0)
Platelets: 263 K/uL (ref 150–400)
RBC: 4.39 MIL/uL (ref 3.87–5.11)
RDW: 14.4 % (ref 11.5–15.5)
WBC: 5.7 K/uL (ref 4.0–10.5)
nRBC: 0 % (ref 0.0–0.2)

## 2024-05-13 LAB — LIPASE, BLOOD: Lipase: 23 U/L (ref 11–51)

## 2024-05-13 MED ORDER — ONDANSETRON 4 MG PO TBDP
4.0000 mg | ORAL_TABLET | Freq: Once | ORAL | Status: AC | PRN
  Administered 2024-05-13: 4 mg via ORAL
  Filled 2024-05-13: qty 1

## 2024-05-13 MED ORDER — SODIUM CHLORIDE 0.9 % IV BOLUS
1000.0000 mL | Freq: Once | INTRAVENOUS | Status: AC
  Administered 2024-05-13: 1000 mL via INTRAVENOUS

## 2024-05-13 NOTE — Discharge Instructions (Signed)
 Your workup is overall reassuring today.  Take the Zofran  as needed.  However follow-up with your primary care doctor and you could have a recheck of your liver enzymes later this week.

## 2024-05-13 NOTE — ED Provider Notes (Signed)
 Kathy Schaefer EMERGENCY DEPARTMENT AT [facility] Provider Note   CSN: [id number] Arrival date & time: 05/13/24  1353     Patient presents with: Abdominal Pain   Kathy Schaefer is a 63 y.o. female.    Abdominal Pain Patient presents with abdominal pain nausea diarrhea.  Began last night.  Did eat some mushrooms from the farmers market thought to be just not mushrooms.  Around 12 to 15 hours later developed with abdominal pain nausea and vomiting.  No other sick contacts.  Took some Zofran  at home and feeling somewhat better but symptoms continued.     Prior to Admission medications   Medication Sig Start Date End Date Taking? Authorizing Provider  ARNICA EX Apply 1 application topically daily as needed (for pain).    [provider]  atorvastatin (LIPITOR) 10 MG tablet Take by mouth. 05/27/21   [provider]  diclofenac  (VOLTAREN ) 75 MG EC tablet Take 75 mg by mouth 2 (two) times daily. 10/05/23   [provider]  EPINEPHrine  0.3 mg/0.3 mL IJ SOAJ injection INJECT INTO THE MIDDLE OF THE OUTER THIGH AND HOLD FOR 10 SECONDS AS NEEDED FOR SEVERE ALLERGIC REACTION THEN CALL 911 IF USED 05/07/24   Kathy Marty Saltness, MD  HYDROcodone -acetaminophen  (NORCO/VICODIN) 5-325 MG tablet Take by mouth. 05/12/22   [provider]  omalizumab  (XOLAIR ) 150 MG/ML prefilled syringe Inject 300 mg into the skin every 28 (twenty-eight) days. 09/27/23   Kathy Marty Saltness, MD  omeprazole (PRILOSEC) 20 MG capsule Take 20 mg by mouth daily.    [provider]  pregabalin (LYRICA) 50 MG capsule Take 50 mg by mouth. 10/08/21   [provider]  tiZANidine (ZANAFLEX) 4 MG tablet TAKE 1 TABLET BY MOUTH THREE TIMES DAILY 05/26/18   [provider]  trolamine salicylate (ASPERCREME) 10 % cream Apply 1 application topically daily as needed for muscle pain.    [provider]    Allergies: Alpha-gal, Phenylpropanolamine hcl, Pork  allergy, Cephalexin, Guaifenesin, Cyclobenzaprine hcl, and Zyrtec [cetirizine]    Review of Systems  Gastrointestinal:  Positive for abdominal pain.    Updated Vital Signs BP 128/81 (BP Location: Right Arm)   Pulse 61   Temp (!) 97.5 F (36.4 C) (Temporal)   Resp 16   SpO2 100%   Physical Exam Vitals and nursing note reviewed.  Cardiovascular:     Rate and Rhythm: Normal rate.  Pulmonary:     Effort: Pulmonary effort is normal.  Abdominal:     Tenderness: There is abdominal tenderness.     Comments: Mild diffuse abdominal tenderness.  No rebound or guarding.  No hernia palpated.  Neurological:     Mental Status: She is alert.     (all labs ordered are listed, but only abnormal results are displayed) Labs Reviewed  COMPREHENSIVE METABOLIC PANEL WITH GFR - Abnormal; Notable for the following components:      Result Value   Glucose, Bld 113 (*)    Alkaline Phosphatase 179 (*)    All other components within normal limits  LIPASE, BLOOD  CBC  URINALYSIS, ROUTINE W REFLEX MICROSCOPIC    EKG: None  Radiology: No results found.   Procedures   Medications Ordered in the ED  ondansetron  (ZOFRAN -ODT) disintegrating tablet 4 mg (4 mg Oral Given 05/13/24 1507)  sodium chloride  0.9 % bolus 1,000 mL (0 mLs Intravenous Stopped 05/13/24 1736)  Medical Decision Making Amount and/or Complexity of Data Reviewed Labs: ordered.  Risk Prescription drug management.   Patient abdominal pain.  Did eat some mushrooms from the farmers market.  Thought to be chestnut mushrooms.       Differential diagnosis does include toxicity from the mushrooms.  Also other causes such as gastroenteritis.  The fact that the nausea and vomiting was delayed is somewhat worrisome.  However LFTs are reassuring.  Will give fluids and has had some Zofran  here.  Feeling better.  Tolerated orals.  Peers stable for discharge home.  However will have PCP recheck  LFTs due to the possibility of toxicity.      Final diagnoses:  Nausea vomiting and diarrhea    ED Discharge Orders     None          Kathy Lot, MD 05/13/24 2313

## 2024-05-13 NOTE — ED Notes (Signed)
Pt instructed to doff clothes and don gown.

## 2024-05-13 NOTE — ED Triage Notes (Signed)
 Pt reports waking in the middle of last night with abdominal pain, nausea, and dizziness she believes may be related to eating some mushrooms she got from the farmers [place] - consumed around 2pm yesterday.  Pt reports sx of nausea, epigastric pain, headache and dizziness at present.

## 2024-05-13 NOTE — ED Notes (Signed)
 DC paperwork given and verbally understood.

## 2024-06-19 ENCOUNTER — Encounter: Payer: Self-pay | Admitting: Allergy & Immunology

## 2024-06-21 ENCOUNTER — Ambulatory Visit: Admitting: Allergy & Immunology

## 2024-06-21 ENCOUNTER — Other Ambulatory Visit: Payer: Self-pay

## 2024-06-21 ENCOUNTER — Encounter: Payer: Self-pay | Admitting: Family Medicine

## 2024-06-21 ENCOUNTER — Ambulatory Visit (INDEPENDENT_AMBULATORY_CARE_PROVIDER_SITE_OTHER): Admitting: Family Medicine

## 2024-06-21 VITALS — BP 130/80 | HR 67 | Temp 98.1°F | Ht 66.25 in | Wt 210.0 lb

## 2024-06-21 DIAGNOSIS — H1013 Acute atopic conjunctivitis, bilateral: Secondary | ICD-10-CM | POA: Diagnosis not present

## 2024-06-21 DIAGNOSIS — L508 Other urticaria: Secondary | ICD-10-CM

## 2024-06-21 DIAGNOSIS — T783XXD Angioneurotic edema, subsequent encounter: Secondary | ICD-10-CM

## 2024-06-21 DIAGNOSIS — Z91014 Allergy to mammalian meats: Secondary | ICD-10-CM | POA: Diagnosis not present

## 2024-06-21 DIAGNOSIS — J302 Other seasonal allergic rhinitis: Secondary | ICD-10-CM

## 2024-06-21 DIAGNOSIS — H1010 Acute atopic conjunctivitis, unspecified eye: Secondary | ICD-10-CM

## 2024-06-21 DIAGNOSIS — J3089 Other allergic rhinitis: Secondary | ICD-10-CM | POA: Diagnosis not present

## 2024-06-21 NOTE — Patient Instructions (Signed)
 Drug allergy Return to clinic for a Celebrex challenge.  We will call in the medication.  Do not take this medication, however, bring this medication to the clinic on your challenge appointment date. Do not take any antihistamines for 3 days before your challenge appointment.  Allergic rhinitis Continue allergen avoidance measures directed toward grass pollen, weed pollen, tree pollen, ragweed pollen, mold, dust mite, cat, dog, and cockroach as listed below  Allergic conjunctivitis Some over the counter eye drops include Pataday one drop in each eye once a day as needed for red, itchy eyes OR Zaditor one drop in each eye twice a day as needed for red itchy eyes. Avoid eye drops that say red eye relief as they may contain medications that dry out your eyes.   Alpha gal allergy Continue to avoid mammalian meats.  In case of an allergic reaction, take cetirizine 10 mg once every 12-24 hours, and if life-threatening symptoms occur, inject with EpiPen  0.3 mg.   Angioedema/urticaria Continue Xolair  injections once every 4 to 5 weeks and have access to an epinephrine  autoinjector set per protocol  Call the clinic if this treatment plan is not working well for you.  Follow up in *** or sooner if needed.  Reducing Pollen Exposure The American Academy of Allergy, Asthma and Immunology suggests the following steps to reduce your exposure to pollen during allergy seasons. Do not hang sheets or clothing out to dry; pollen may collect on these items. Do not mow lawns or spend time around freshly cut grass; mowing stirs up pollen. Keep windows closed at night.  Keep car windows closed while driving. Minimize morning activities outdoors, a time when pollen counts are usually at their highest. Stay indoors as much as possible when pollen counts or humidity is high and on windy days when pollen tends to remain in the air longer. Use air conditioning when possible.  Many air conditioners have filters that  trap the pollen spores. Use a HEPA room air filter to remove pollen form the indoor air you breathe.    Control of Mold Allergen Mold and fungi can grow on a variety of surfaces provided certain temperature and moisture conditions exist.  Outdoor molds grow on plants, decaying vegetation and soil.  The major outdoor mold, Alternaria and Cladosporium, are found in very high numbers during hot and dry conditions.  Generally, a late Summer - Fall peak is seen for common outdoor fungal spores.  Rain will temporarily lower outdoor mold spore count, but counts rise rapidly when the rainy period ends.  The most important indoor molds are Aspergillus and Penicillium.  Dark, humid and poorly ventilated basements are ideal sites for mold growth.  The next most common sites of mold growth are the bathroom and the kitchen.  Outdoor [employer] Use air conditioning and keep windows closed Avoid exposure to decaying vegetation. Avoid leaf raking. Avoid grain handling. Consider wearing a face mask if working in moldy areas.  Indoor Mold Control Maintain humidity below 50%. Clean washable surfaces with 5% bleach solution. Remove sources e.g. Contaminated carpets.  Control of Dust Mite Allergen Dust mites play a major role in allergic asthma and rhinitis. They occur in environments with high humidity wherever human skin is found. Dust mites absorb humidity from the atmosphere (ie, they do not drink) and feed on organic matter (including shed human and animal skin). Dust mites are a microscopic type of insect that you cannot see with the naked eye. High levels of dust mites  have been detected from mattresses, pillows, carpets, upholstered furniture, bed covers, clothes, soft toys and any woven material. The principal allergen of the dust mite is found in its feces. A gram of dust may contain 1,000 mites and 250,000 fecal particles. Mite antigen is easily measured in the air during house cleaning activities. Dust  mites do not bite and do not cause harm to humans, other than by triggering allergies/asthma.  Ways to decrease your exposure to dust mites in your home:  1. Encase mattresses, box springs and pillows with a mite-impermeable barrier or cover 2. Wash sheets, blankets and drapes weekly in hot water (130 F) with detergent and dry them in a dryer on the hot setting. 3. Have the room cleaned frequently with a vacuum cleaner and a damp dust-mop. For carpeting or rugs, vacuuming with a vacuum cleaner equipped with a high-efficiency particulate air (HEPA) filter. The dust mite allergic individual should not be in a room which is being cleaned and should wait 1 hour after cleaning before going into the room. 4. Do not sleep on upholstered furniture (eg, couches). 5. If possible removing carpeting, upholstered furniture and drapery from the home is ideal. Horizontal blinds should be eliminated in the rooms where the person spends the most time (bedroom, study, television room). Washable vinyl, roller-type shades are optimal. 6. Remove all non-washable stuffed toys from the bedroom. Wash stuffed toys weekly like sheets and blankets above. 7. Reduce indoor humidity to less than 50%. Inexpensive humidity monitors can be purchased at most hardware stores. Do not use a humidifier as can make the problem worse and are not recommended.  Control of Dog or Cat Allergen Avoidance is the best way to manage a dog or cat allergy. If you have a dog or cat and are allergic to dog or cats, consider removing the dog or cat from the home. If you have a dog or cat but don't want to find it a new home, or if your family wants a pet even though someone in the household is allergic, here are some strategies that may help keep symptoms at bay:  Keep the pet out of your bedroom and restrict it to only a few rooms. Be advised that keeping the dog or cat in only one room will not limit the allergens to that room. Don't pet, hug or  kiss the dog or cat; if you do, wash your hands with soap and water. High-efficiency particulate air (HEPA) cleaners run continuously in a bedroom or living room can reduce allergen levels over time. Regular use of a high-efficiency vacuum cleaner or a central vacuum can reduce allergen levels. Giving your dog or cat a bath at least once a week can reduce airborne allergen.  Control of Cockroach Allergen Cockroach allergen has been identified as an important cause of acute attacks of asthma, especially in urban settings.  There are fifty-five species of cockroach that exist in the [place] , however only three, the American, German and Oriental species produce allergen that can affect patients with Asthma.  Allergens can be obtained from fecal particles, egg casings and secretions from cockroaches.    Remove food sources. Reduce access to water. Seal access and entry points. Spray runways with 0.5-1% Diazinon or Chlorpyrifos Blow boric acid power under stoves and refrigerator. Place bait stations (hydramethylnon) at feeding sites.

## 2024-06-21 NOTE — Progress Notes (Signed)
 " [address] [place] [address] Dept: [phone]  FOLLOW UP NOTE  Patient ID: Kathy Schaefer, female    DOB: 07/04/1961  Age: 63 y.o. MRN: [id number] Date of Office Visit: 06/21/2024  Assessment  Chief Complaint: Advice Only  HPI Kathy Schaefer is a 63 year old female who presents to the clinic for follow-up visit.  She was last seen in this clinic on 04/10/2024 by Dr. Iva for evaluation of allergic rhinitis, allergic conjunctivitis, alpha gal allergy, angioedema, urticaria, and esophageal spasms.  Her last environmental allergy testing by lab on 09/23/2020 was positive to dust mites, cat, dog, grass pollen, cockroach, mold, tree pollen, weed pollen, and ragweed pollen.  Discussed the use of AI scribe software for clinical note transcription with the patient, who gave verbal consent to proceed.  History of Present Illness Kathy Schaefer is a 63 year old female who presents with concerns about diclofenac  causing angioedema. She was referred by Dr. Nyle Geralds for evaluation of potential diclofenac -induced angioedema.  She has been on diclofenac  since 2002 for osteoarthritis management. She has experienced angioedema since 2021, characterized by swelling of the lips, tongue, throat, and gut, which occurred every other day before starting Xolair .  Xolair  has effectively managed her angioedema symptoms. Prior to Xolair , the swelling was severe enough to prevent speaking and caused esophageal spasms. She attempted to stop Xolair  twice but experienced esophageal swelling, prompting resumption of the medication. Recently, she managed six weeks without Xolair  before experiencing intestinal discomfort, which resolved upon resuming the injections. EpiPen  set is up to date.  She has a history of alpha-gal syndrome, managed by avoiding mammalian meat for eight years. Recently, she has been able to consume small amounts of mammalian meat without issues.  She underwent spinal surgery in  February and was temporarily switched to Celebrex, which was ineffective for her osteoarthritis pain. She resumed diclofenac  after a week due to increased pain and difficulty moving. She takes diclofenac  twice daily and reports significant pain relief, stating that missing a dose results in severe pain and difficulty with mobility.  No itching or other allergic reactions to diclofenac . She attributes her angioedema to environmental factors, particularly after moving to a musty house in [place] in 2021.   Allergic rhinitis is reported as moderately well controlled. She sometimes experiences fall allergies with a runny nose. Allergic conjunctivitis is reported as well controlled with occasional over the counter eye drops.   Her current medications are listed in the chart.   Drug Allergies:  Allergies  Allergen Reactions   Alpha-Gal Anaphylaxis    BEEF, LAMB, PORK   Phenylpropanolamine Hcl Swelling    SWELLING REACTION UNSPECIFIED    Pork Allergy Anaphylaxis, Hives and Swelling   Cephalexin Hives, Itching, Rash and Swelling    Hives and swelling    Guaifenesin Anxiety and Other (See Comments)    Every nerve in body was on edge  Unable to describe per patient   Cyclobenzaprine Hcl Other (See Comments)    Headache   Zyrtec [Cetirizine] Palpitations    Physical Exam: BP 130/80   Pulse 67   Temp 98.1 F (36.7 C)   Ht 5' 6.25 (1.683 m)   Wt 210 lb (95.3 kg)   SpO2 99%   BMI 33.64 kg/m    Assessment and Plan: 1. Seasonal and perennial allergic rhinitis   2. Seasonal allergic conjunctivitis   3. Angioedema, subsequent encounter   4. Alpha-gal syndrome   5. Chronic urticaria     Patient  Instructions  Allergic rhinitis Continue allergen avoidance measures directed toward grass pollen, weed pollen, tree pollen, ragweed pollen, mold, dust mite, cat, dog, and cockroach as listed below Take an antihistamine once a day if needed for a runny nose or itch Consider saline nasal rinses  as needed for nasal symptoms. Use this before any medicated nasal sprays for best result  Allergic conjunctivitis Some over the counter eye drops include Pataday one drop in each eye once a day as needed for red, itchy eyes OR Zaditor one drop in each eye twice a day as needed for red itchy eyes. Avoid eye drops that say red eye relief as they may contain medications that dry out your eyes.   Alpha gal allergy Continue to avoid mammalian meats.  In case of an allergic reaction, take cetirizine 10 mg once every 12-24 hours, and if life-threatening symptoms occur, inject with EpiPen  0.3 mg.   Angioedema/urticaria Continue Xolair  injections once every 4 to 5 weeks and have access to an epinephrine  autoinjector set per protocol Continue diclofenac  for now as this treatment plan has been very effective for you.  If your surgeon wants to change your pain medication just let us  know  Call the clinic if this treatment plan is not working well for you.  Follow up in 6 months or sooner if needed.   Return in about 6 months (around 12/19/2024), or if symptoms worsen or fail to improve.    Thank you for the opportunity to care for this patient.  Please do not hesitate to contact me with questions.  Arlean Mutter, FNP [facility]       "

## 2024-06-22 ENCOUNTER — Encounter: Payer: Self-pay | Admitting: Family Medicine

## 2024-06-22 DIAGNOSIS — Z91014 Allergy to mammalian meats: Secondary | ICD-10-CM | POA: Insufficient documentation

## 2024-06-22 DIAGNOSIS — H1010 Acute atopic conjunctivitis, unspecified eye: Secondary | ICD-10-CM | POA: Insufficient documentation

## 2024-06-25 ENCOUNTER — Encounter: Payer: Self-pay | Admitting: Family Medicine

## 2024-06-25 ENCOUNTER — Other Ambulatory Visit: Payer: Self-pay | Admitting: Orthopedic Surgery

## 2024-06-25 DIAGNOSIS — M5416 Radiculopathy, lumbar region: Secondary | ICD-10-CM

## 2024-06-28 NOTE — Telephone Encounter (Signed)
 Kathy Schaefer sent fax to Dr. Nyle

## 2024-09-14 ENCOUNTER — Ambulatory Visit
Admission: EM | Admit: 2024-09-14 | Discharge: 2024-09-14 | Disposition: A | Discharge status: Home / Self Care | Source: Home / Self Care

## 2024-09-14 ENCOUNTER — Encounter (HOSPITAL_COMMUNITY): Payer: Self-pay

## 2024-09-14 ENCOUNTER — Emergency Department (HOSPITAL_COMMUNITY)
Admission: EM | Admit: 2024-09-14 | Discharge: 2024-09-14 | Disposition: A | Discharge status: Home / Self Care | Source: Home / Self Care | Attending: Emergency Medicine | Admitting: Emergency Medicine

## 2024-09-14 ENCOUNTER — Emergency Department (HOSPITAL_COMMUNITY)

## 2024-09-14 ENCOUNTER — Other Ambulatory Visit: Payer: Self-pay

## 2024-09-14 DIAGNOSIS — R110 Nausea: Secondary | ICD-10-CM

## 2024-09-14 DIAGNOSIS — R1031 Right lower quadrant pain: Secondary | ICD-10-CM

## 2024-09-14 LAB — URINALYSIS, ROUTINE W REFLEX MICROSCOPIC
Bacteria, UA: NONE SEEN
Bilirubin Urine: NEGATIVE
Glucose, UA: NEGATIVE mg/dL
Hgb urine dipstick: NEGATIVE
Ketones, ur: NEGATIVE mg/dL
Nitrite: NEGATIVE
Protein, ur: NEGATIVE mg/dL
Specific Gravity, Urine: 1.01 (ref 1.005–1.030)
pH: 6 (ref 5.0–8.0)

## 2024-09-14 LAB — COMPREHENSIVE METABOLIC PANEL WITH GFR
ALT: 12 U/L (ref 0–44)
AST: 24 U/L (ref 15–41)
Albumin: 4.1 g/dL (ref 3.5–5.0)
Alkaline Phosphatase: 162 U/L — ABNORMAL HIGH (ref 38–126)
Anion gap: 10 (ref 5–15)
BUN: 19 mg/dL (ref 8–23)
CO2: 29 mmol/L (ref 22–32)
Calcium: 9.3 mg/dL (ref 8.9–10.3)
Chloride: 100 mmol/L (ref 98–111)
Creatinine, Ser: 0.83 mg/dL (ref 0.44–1.00)
GFR, Estimated: >60 mL/min
Glucose, Bld: 96 mg/dL (ref 70–99)
Potassium: 3.7 mmol/L (ref 3.5–5.1)
Sodium: 138 mmol/L (ref 135–145)
Total Bilirubin: 0.3 mg/dL (ref 0.0–1.2)
Total Protein: 6.9 g/dL (ref 6.5–8.1)

## 2024-09-14 LAB — CBC
HCT: 37.1 % (ref 36.0–46.0)
Hemoglobin: 12.1 g/dL (ref 12.0–15.0)
MCH: 28.1 pg (ref 26.0–34.0)
MCHC: 32.6 g/dL (ref 30.0–36.0)
MCV: 86.1 fL (ref 80.0–100.0)
Platelets: 321 10*3/uL (ref 150–400)
RBC: 4.31 MIL/uL (ref 3.87–5.11)
RDW: 13.8 % (ref 11.5–15.5)
WBC: 6.8 10*3/uL (ref 4.0–10.5)
nRBC: 0 % (ref 0.0–0.2)

## 2024-09-14 LAB — LIPASE, BLOOD: Lipase: 20 U/L (ref 11–51)

## 2024-09-14 MED ORDER — KETOROLAC TROMETHAMINE 10 MG PO TABS: 10.0000 mg | ORAL_TABLET | Freq: Four times a day (QID) | ORAL | 0 refills | Status: AC | PRN

## 2024-09-14 MED ORDER — IOHEXOL 350 MG/ML SOLN
75.0000 mL | Freq: Once | INTRAVENOUS | Status: AC | PRN
  Administered 2024-09-14: 75 mL via INTRAVENOUS

## 2024-09-14 MED ORDER — FENTANYL CITRATE (PF) 50 MCG/ML IJ SOSY
25.0000 ug | PREFILLED_SYRINGE | Freq: Once | INTRAMUSCULAR | Status: AC
  Administered 2024-09-14: 25 ug via INTRAVENOUS
  Filled 2024-09-14: qty 1

## 2024-09-14 NOTE — Discharge Instructions (Addendum)
 You were seen today for abdominal pain in the right lower quadrant which I am concerned may correlate to appendicitis. Please go directly to the ER for evaluation and further management. You have elected to go on your own via private vehicle. If you start to have more severe pain, fevers, nausea and vomiting that prevents you from eating or drinking, please call 911 to help you.

## 2024-09-14 NOTE — ED Provider Notes (Signed)
 [facility]    CSN: [id number] Arrival date & time: 09/14/24  0844      History   Chief Complaint Chief Complaint  Patient presents with   Abdominal Pain   Nausea    HPI Kathy Schaefer is a 64 y.o. female.  has a past medical history of Abdominal aortic aneurysm (AAA), Allergy to alpha-gal, Angio-edema, Chronic urticaria (03/26/2023), DDD (degenerative disc disease), lumbar, Degenerative disc disease, Depression, GERD (gastroesophageal reflux disease), Head ache, History of hiatal hernia, Hyperlipidemia, Hypoglycemia, Neuropathy, and Stroke (HCC) (2018).   HPI  Pt is here today with concerns for RLQ abdominal pain, nausea. She states the pain started yesterday and the nausea started this AM. She reports that yesterday she had severe pain in the RLQ when she tried to get out of bed. She reports that now the pain has become yell out pain especially when she has tried to move at night or change positions. She does acknowledge a recent bout of constipation, last bowel movement was this AM but she still feels like she needs to go. She also reports a previous hx of diverticulosis but states that pain has usually been on the LLQ area and feels different.  She has had her gallbladder removed at age 72  Patient states that she has a prescription of Zofran  at home which she will take prior to going to the ER to help with nausea.  Offered Zofran  injection and Zofran  ODT here in clinic but patient declined stating she will use at home medication.   Past Medical History:  Diagnosis Date   Abdominal aortic aneurysm (AAA)    ectatic abdominal aorta 2016   Allergy to alpha-gal    Angio-edema    Chronic urticaria 03/26/2023   DDD (degenerative disc disease), lumbar    and cervical per pt   Degenerative disc disease    Depression    pt denies   GERD (gastroesophageal reflux disease)    Head ache    History of hiatal hernia    years ago   Hyperlipidemia    Hypoglycemia    when  she doesnt eat or have protein   Neuropathy    Stroke (HCC) 2018   TIA    Patient Active Problem List   Diagnosis Date Noted   Seasonal allergic conjunctivitis 06/22/2024   Alpha-gal syndrome 06/22/2024   Postherpetic neuralgia 09/07/2023   Allergy to alpha-gal 08/17/2023   Seasonal and perennial allergic rhinitis 03/26/2023   Chronic urticaria 03/26/2023   Abnormal respiratory laboratory results 03/26/2023   Sciatic leg pain 12/26/2021   Osteopenia of multiple sites 09/25/2021   Low back pain 07/08/2021   Anemia 05/20/2021   Ectatic abdominal aorta 04/16/2021   Hypertensive disorder 12/22/2020   Angio-edema 12/22/2020   Obesity (BMI 30-39.9) 12/22/2020   Neck pain 10/25/2020   Irritable bowel syndrome with constipation 05/21/2020   Gastroparesis 03/31/2020   Prediabetes 06/04/2019   Chronic pain of right knee 12/01/2018   Diverticulosis 12/01/2018   Sacro-iliac pain 04/30/2018   Polyarthralgia 02/17/2018   Encounter for long-term use of opiate analgesic 02/17/2018   Chronic pain disorder 02/17/2018   Cervical spondylosis without myelopathy 02/17/2018   Arthropathy of cervical facet joint 02/17/2018   Tobacco use disorder 02/03/2018   History of stroke 02/03/2018   Spondylosis of lumbar spine 01/04/2018   Pain of right heel 01/04/2018   DDD (degenerative disc disease), cervical 01/04/2018   Generalized OA 01/04/2018   Familial hypercholesterolemia 01/04/2018   Bone  spur of foot 11/07/2017   Torn Achilles tendon 11/07/2017   High triglycerides 11/16/2013   Intervertebral disc protrusion 11/16/2013   Osteoarthritis 03/27/2013   GERD 06/13/2007   PLANTAR FASCIITIS 06/13/2007    Past Surgical History:  Procedure Laterality Date   COLONOSCOPY     MOUTH SURGERY  03/17/2018   removed 8 bottom teeth   TOOTH EXTRACTION N/A 03/17/2018   Procedure: DENTAL EXTRACTIONS TEETH NUMBER TWENTY ONE, TWENTY TWO, TWENTY THREE, TWENTY FOUR, TWENTY FIVE, TWENTY SIX, TWENTY SEVEN,  TWENTY EIGHT, ALVEOLOPLASTY;  Surgeon: Sheryle Hamilton, DDS;  Location: [facility] OR;  Service: Oral Surgery;  Laterality: N/A;    OB History   No obstetric history on file.      Home Medications    Prior to Admission medications  Medication Sig Start Date End Date Taking? Authorizing Provider  ARNICA EX Apply 1 application topically daily as needed (for pain).    [provider]  atorvastatin (LIPITOR) 10 MG tablet Take by mouth. 05/27/21   [provider]  diclofenac  (VOLTAREN ) 75 MG EC tablet Take 75 mg by mouth 2 (two) times daily. 10/05/23   [provider]  EPINEPHrine  0.3 mg/0.3 mL IJ SOAJ injection INJECT INTO THE MIDDLE OF THE OUTER THIGH AND HOLD FOR 10 SECONDS AS NEEDED FOR SEVERE ALLERGIC REACTION THEN CALL 911 IF USED 05/07/24   Iva Marty Saltness, MD  HYDROcodone -acetaminophen  (NORCO/VICODIN) 5-325 MG tablet Take by mouth. 05/12/22   [provider]  omalizumab  (XOLAIR ) 150 MG/ML prefilled syringe Inject 300 mg into the skin every 28 (twenty-eight) days. 09/27/23   Iva Marty Saltness, MD  omeprazole (PRILOSEC) 20 MG capsule Take 20 mg by mouth daily.    [provider]  pregabalin (LYRICA) 50 MG capsule Take 50 mg by mouth. 10/08/21   [provider]  tiZANidine (ZANAFLEX) 4 MG tablet TAKE 1 TABLET BY MOUTH THREE TIMES DAILY 05/26/18   [provider]  trolamine salicylate (ASPERCREME) 10 % cream Apply 1 application topically daily as needed for muscle pain.    [provider]    Family History Family History  Problem Relation Age of Onset   Emphysema Mother    Diabetes Mother    Heart disease Mother    Arthritis Mother    Pancreatic cancer Father    Colon cancer Father    ALS Sister    Hyperlipidemia Brother    Esophageal cancer Neg Hx    Rectal cancer Neg Hx    Stomach cancer Neg Hx    Colon polyps Neg Hx    Allergic rhinitis Neg Hx    Angioedema Neg Hx    Asthma Neg Hx    Eczema Neg Hx     Immunodeficiency Neg Hx    Urticaria Neg Hx     Social History Social History[1]   Allergies   Alpha-gal, Phenylpropanolamine hcl, Pork allergy, Cephalexin, Guaifenesin, Cyclobenzaprine hcl, and Zyrtec [cetirizine]   Review of Systems Review of Systems  Constitutional:  Negative for chills and fever.  Gastrointestinal:  Positive for abdominal pain, constipation and nausea. Negative for diarrhea and vomiting.     Physical Exam Triage Vital Signs ED Triage Vitals  Encounter Vitals Group     BP 09/14/24 0858 126/85     Girls Systolic BP Percentile --      Girls Diastolic BP Percentile --      Boys Systolic BP Percentile --      Boys Diastolic BP Percentile --  Pulse Rate 09/14/24 0858 77     Resp 09/14/24 0858 18     Temp 09/14/24 0858 97.9 F (36.6 C)     Temp Source 09/14/24 0858 Oral     SpO2 09/14/24 0858 96 %     Weight 09/14/24 0858 221 lb (100.2 kg)     Height 09/14/24 0858 5' 6 (1.676 m)     Head Circumference --      Peak Flow --      Pain Score 09/14/24 0912 4     Pain Loc --      Pain Education --      Exclude from Growth Chart --    No data found.  Updated Vital Signs BP 126/85 (BP Location: Right Arm)   Pulse 77   Temp 97.9 F (36.6 C) (Oral)   Resp 18   Ht 5' 6 (1.676 m)   Wt 221 lb (100.2 kg)   SpO2 96%   BMI 35.67 kg/m   Visual Acuity Right Eye Distance:   Left Eye Distance:   Bilateral Distance:    Right Eye Near:   Left Eye Near:    Bilateral Near:     Physical Exam Vitals reviewed.  Constitutional:      General: She is awake. She is in acute distress.     Appearance: Normal appearance. She is well-developed and well-groomed. She is not ill-appearing, toxic-appearing or diaphoretic.  HENT:     Head: Normocephalic and atraumatic.  Eyes:     General: Lids are normal. Gaze aligned appropriately.     Extraocular Movements: Extraocular movements intact.     Conjunctiva/sclera: Conjunctivae normal.  Cardiovascular:     Rate  and Rhythm: Normal rate and regular rhythm.     Heart sounds: Normal heart sounds. No murmur heard.    No friction rub. No gallop.  Pulmonary:     Effort: Pulmonary effort is normal.     Breath sounds: Normal breath sounds. No decreased air movement. No decreased breath sounds, wheezing, rhonchi or rales.  Abdominal:     General: Abdomen is flat. Bowel sounds are decreased.     Palpations: Abdomen is soft.     Tenderness: There is abdominal tenderness in the right lower quadrant. There is guarding. There is no rebound. Positive signs include McBurney's sign and obturator sign. Negative signs include Rovsing's sign.  Neurological:     Mental Status: She is alert and oriented to person, place, and time.  Psychiatric:        Attention and Perception: Attention and perception normal.        Mood and Affect: Mood and affect normal.        Speech: Speech normal.        Behavior: Behavior normal. Behavior is cooperative.        Thought Content: Thought content normal.        Judgment: Judgment normal.      UC Treatments / Results  Labs (all labs ordered are listed, but only abnormal results are displayed) Labs Reviewed - No data to display  EKG   Radiology No results found.  Procedures Procedures (including critical care time)  Medications Ordered in UC Medications - No data to display  Initial Impression / Assessment and Plan / UC Course  I have reviewed the triage vital signs and the nursing notes.  Pertinent labs & imaging results that were available during my care of the patient were reviewed by me and considered in my medical  decision making (see chart for details).      Final Clinical Impressions(s) / UC Diagnoses   Final diagnoses:  RLQ abdominal pain  Nausea without vomiting   Patient presents today with concerns of severe right lower quadrant abdominal pain and tenderness.  She states that the pain started yesterday and this morning was accompanied by nausea  without vomiting.  Physical exam is notable for McBurney's sign, slight guarding of the RLQ.  She also has positive obturator sign which further increases my suspicion for appendicitis.  Cannot definitively rule out diverticulitis, severe constipation, small bowel obstruction.  Reviewed with patient that this will require advanced imaging as well as further management in the emergency room.  Patient is amenable to going and states she will go via private vehicle, declines EMS transportation.  She declines Zofran  administration here in clinic stating that she has supply at home which she will take prior to going to the ER.  Reviewed signs to call 911 for assistance should this occur.  Follow-up as indicated.    Discharge Instructions      You were seen today for abdominal pain in the right lower quadrant which I am concerned may correlate to appendicitis. Please go directly to the ER for evaluation and further management. You have elected to go on your own via private vehicle. If you start to have more severe pain, fevers, nausea and vomiting that prevents you from eating or drinking, please call 911 to help you.       ED Prescriptions   None    PDMP not reviewed this encounter.     [1]  Social History Tobacco Use   Smoking status: Former    Current packs/day: 0.00    Average packs/day: 0.3 packs/day    Types: Cigarettes    Quit date: 08/10/2019    Years since quitting: 5.1   Smokeless tobacco: Never  Vaping Use   Vaping status: Former  Substance Use Topics   Alcohol use: Not Currently   Drug use: No     Iren Whipp E, PA-C 09/14/24 1018  "

## 2024-09-14 NOTE — ED Notes (Signed)
..  Patient is A&Ox4 upon discharge. Patient verbalized understanding of prescriptions, discharge instructions and follow-up care. Patient ambulatory from ED with steady gait.

## 2024-09-14 NOTE — ED Notes (Signed)
 Patient transported to Ultrasound

## 2024-09-14 NOTE — ED Triage Notes (Signed)
 Pt reports she went to UC this morning for LLQ abdominal pain with nausea, no vomiting that started yesterday morning that's continued to increase. Sent here for imaging to rule out appendicitis.

## 2024-09-14 NOTE — Discharge Instructions (Addendum)
 You were seen in the emergency department today for concerns of abdominal pain.  Your labs and imaging were thankfully reassuring with no obvious findings seen.  There is no signs of appendicitis, ovarian torsion, or an ovarian cyst or mass.  There is some mild constipation seen on your CT imaging but this is likely not enough to cause the amount of pain that you are experiencing.  I suspect you may have a strain of the psoas muscle in this area that will take a few days to subside.  You can take anti-inflammatories the meantime.  I have changed her diclofenac  to help temporarily for the next week with Toradol  taking its place.  This is only meant to be a short-term replacement.  As you are currently on a combination of the medications, it will be difficult to make changes to these medications and I will reach out to your primary care provider or the physician who prescribed these medications to discuss any short-term changes that they may be able to make to help address this pain.   If you notice any significant change in your symptoms, worsening pain, fevers, nausea, vomiting, diarrhea, please return to the emergency department for further assessment.

## 2024-09-14 NOTE — ED Provider Notes (Signed)
 " [place] EMERGENCY DEPARTMENT AT [facility] Provider Note   CSN: [id number] Arrival date & time: 09/14/24  1102     Patient presents with: Abdominal Pain   Kathy Schaefer is a 64 y.o. female.  Patient with past history significant for degenerative disc disease, anemia, gastroparesis presents ED with concerns of abdominal pain.  Was reportedly seen at urgent care this morning for concerns of abdominal discomfort and advised come in to evaluate for concerns of possible appendicitis.  Reports pain started yesterday morning spontaneously waking her out of her sleep.  States that she initially felt some left-sided discomfort but now feels that the pain is localized to the right side.  No vomiting, diarrhea, fever, chills or bodyaches.  She still has ovaries intact.  Appendix still present.  Denies any obvious urinary symptoms.   Abdominal Pain      Prior to Admission medications  Medication Sig Start Date End Date Taking? Authorizing Provider  ARNICA EX Apply 1 application topically daily as needed (for pain).    [provider]  atorvastatin (LIPITOR) 10 MG tablet Take by mouth. 05/27/21   [provider]  diclofenac  (VOLTAREN ) 75 MG EC tablet Take 75 mg by mouth 2 (two) times daily. 10/05/23   [provider]  EPINEPHrine  0.3 mg/0.3 mL IJ SOAJ injection INJECT INTO THE MIDDLE OF THE OUTER THIGH AND HOLD FOR 10 SECONDS AS NEEDED FOR SEVERE ALLERGIC REACTION THEN CALL 911 IF USED 05/07/24   Iva Marty Saltness, MD  HYDROcodone -acetaminophen  (NORCO/VICODIN) 5-325 MG tablet Take by mouth. 05/12/22   [provider]  ketorolac  (TORADOL ) 10 MG tablet Take 1 tablet (10 mg total) by mouth every 6 (six) hours as needed for up to 7 days. 09/14/24 09/21/24 Yes Aadhya Bustamante A, PA-C  omalizumab  (XOLAIR ) 150 MG/ML prefilled syringe Inject 300 mg into the skin every 28 (twenty-eight) days. 09/27/23   Iva Marty Saltness, MD  omeprazole (PRILOSEC) 20 MG capsule  Take 20 mg by mouth daily.    [provider]  pregabalin (LYRICA) 50 MG capsule Take 50 mg by mouth. 10/08/21   [provider]  tiZANidine (ZANAFLEX) 4 MG tablet TAKE 1 TABLET BY MOUTH THREE TIMES DAILY 05/26/18   [provider]  trolamine salicylate (ASPERCREME) 10 % cream Apply 1 application topically daily as needed for muscle pain.    [provider]    Allergies: Alpha-gal, Phenylpropanolamine hcl, Pork allergy, Cephalexin, Guaifenesin, Cyclobenzaprine hcl, and Zyrtec [cetirizine]    Review of Systems  Gastrointestinal:  Positive for abdominal pain.  All other systems reviewed and are negative.   Updated Vital Signs BP 134/75 (BP Location: Right Arm)   Pulse 60   Temp 98.2 F (36.8 C)   Resp 20   SpO2 100%   Physical Exam Vitals and nursing note reviewed.  Constitutional:      General: She is not in acute distress.    Appearance: She is well-developed.  HENT:     Head: Normocephalic and atraumatic.  Eyes:     Conjunctiva/sclera: Conjunctivae normal.  Cardiovascular:     Rate and Rhythm: Normal rate and regular rhythm.     Heart sounds: No murmur heard. Pulmonary:     Effort: Pulmonary effort is normal. No respiratory distress.     Breath sounds: Normal breath sounds.  Abdominal:     Palpations: Abdomen is soft.     Tenderness: There is abdominal tenderness in the right lower quadrant. There is guarding. Positive signs  include psoas sign.     Hernia: No hernia is present.  Musculoskeletal:        General: No swelling.     Cervical back: Neck supple.  Skin:    General: Skin is warm and dry.     Capillary Refill: Capillary refill takes less than 2 seconds.  Neurological:     Mental Status: She is alert.  Psychiatric:        Mood and Affect: Mood normal.     (all labs ordered are listed, but only abnormal results are displayed) Labs Reviewed  COMPREHENSIVE METABOLIC PANEL WITH GFR - Abnormal; Notable for the following  components:      Result Value   Alkaline Phosphatase 162 (*)    All other components within normal limits  URINALYSIS, ROUTINE W REFLEX MICROSCOPIC - Abnormal; Notable for the following components:   Leukocytes,Ua TRACE (*)    All other components within normal limits  LIPASE, BLOOD  CBC    EKG: None  Radiology: US  PELVIC COMPLETE W TRANSVAGINAL AND TORSION R/O Result Date: 09/14/2024 EXAM: US  Pelvis, Complete Transvaginal and Transabdominal with Doppler TECHNIQUE: Transabdominal and transvaginal pelvic duplex ultrasound using B-mode/gray scaled imaging with Doppler spectral analysis and color flow was obtained. COMPARISON: None available. CLINICAL HISTORY: Right lower quadrant abdominal pain. FINDINGS: UTERUS: The uterus is anteverted and measures 4.5 x 1.5 x 3.3 cm. A fibroid is present, measuring 2.0 x 1.8 x 2.2 cm. ENDOMETRIUM: The endometrium measures 2 mm. RIGHT OVARY: The right ovary measures 2.0 x 1.4 x 1.2 cm with a volume of 1.8 mL. Color Doppler flow, arterial flow, and venous flow are present in the right ovary. LEFT OVARY: The left ovary is not visualized due to bowel and gas. FREE FLUID: No free fluid is present. IMPRESSION: 1. No right adnexal torsion. 2. Uterine fibroids. 3. Left ovary not visualized. Electronically signed by: Pinkie Pebbles MD 09/14/2024 06:57 PM EST RP Workstation: [id number]   CT ABDOMEN PELVIS W CONTRAST Result Date: 09/14/2024 CLINICAL DATA:  Right lower quadrant abdominal pain EXAM: CT ABDOMEN AND PELVIS WITH CONTRAST TECHNIQUE: Multidetector CT imaging of the abdomen and pelvis was performed using the standard protocol following bolus administration of intravenous contrast. RADIATION DOSE REDUCTION: This exam was performed according to the departmental dose-optimization program which includes automated exposure control, adjustment of the mA and/or kV according to patient size and/or use of iterative reconstruction technique. CONTRAST:  75mL OMNIPAQUE  IOHEXOL   350 MG/ML SOLN COMPARISON:  12/16/2014 FINDINGS: Lower chest: Clear lung bases. Normal heart size without pericardial or pleural effusion. Tiny hiatal hernia. Hepatobiliary: Subtle irregular hepatic capsule. Cholecystectomy, without biliary ductal dilatation. Pancreas: Fatty replaced pancreas, especially the body and head. No duct dilatation or acute inflammation. Spleen: Normal in size, without focal abnormality. Adrenals/Urinary Tract: Normal adrenal glands. Normal kidneys, without hydronephrosis. Normal urinary bladder. Stomach/Bowel: Proximal gastric underdistention. Extensive colonic diverticulosis. Wall thickening throughout the sigmoid is most likely related to muscular hypertrophy. Colonic stool burden suggests constipation. Normal terminal ileum and appendix. Normal small bowel. Vascular/Lymphatic: Infrarenal abdominal aortic dilatation at maximally 3.1 cm today versus 2.6 cm on the prior. Aortic atherosclerosis. No abdominopelvic adenopathy. Reproductive: Normal uterus and adnexa. Other: No significant free fluid.  Mild pelvic floor laxity. Musculoskeletal: Osteopenia.  Lumbosacral spondylosis. IMPRESSION: 1. Normal appendix. Possible constipation. No other explanation for right lower quadrant pain. 2. Hepatic morphology suspicious for mild cirrhosis. Correlate with risk factors. 3. Mild infrarenal abdominal aortic dilatation at 3.1 cm. Recommend follow-up ultrasound every 3 years. (Ref.:  J Vasc Surg. 2018; 67:2-77 and J Am Coll Radiol 2013;10(10):789-794.) 4. Incidental findings, including: Tiny hiatal hernia. Aortic Atherosclerosis (ICD10-I70.0). Extensive colonic diverticulosis. Electronically Signed   By: Rockey Kilts M.D.   On: 09/14/2024 14:23     Procedures   Medications Ordered in the ED  iohexol  (OMNIPAQUE ) 350 MG/ML injection 75 mL (75 mLs Intravenous Contrast Given 09/14/24 1301)  fentaNYL  (SUBLIMAZE ) injection 25 mcg (25 mcg Intravenous Given 09/14/24 1707)                                     Medical Decision Making Amount and/or Complexity of Data Reviewed Labs: ordered. Radiology: ordered.  Risk Prescription drug management.   This patient presents to the ED for concern of abdominal pain.  Differential diagnosis includes appendicitis, ovarian torsion, ovarian cyst, ovarian mass, diverticulitis    Additional history obtained:  Additional history obtained from chart review   Lab Tests:  I Ordered, and personally interpreted labs.  The pertinent results include: CBC unremarkable, CMP unremarkable, UA without obvious signs infection, lipase negative   Imaging Studies ordered:  I ordered imaging studies including CT abdomen pelvis, US  pelvic complete with torsion rule out I independently visualized and interpreted imaging which showed negative for any acute findings, mild constipation present.  No appreciable ovarian torsion on the right lower side.  Uterine fibroids present. I agree with the radiologist interpretation   Medicines ordered and prescription drug management:  I ordered medication including fentanyl  for pain Reevaluation of the patient after these medicines showed that the patient improved I have reviewed the patients home medicines and have made adjustments as needed   Problem List / ED Course:  Patient presents to the ED with concerns of abdominal pain present to the RLQ. States symptoms started yesterday morning and have not improved or changed areas. Seen at [facility] and advised to come in for further evaluation. No nausea, vomiting, diarrhea, fever, chills, or bodyaches. Still has ovaries and appendix present. Denies acute vaginal symptoms. Exam reveals notable RLQ tenderness to palpation. Guarding seen. Positive psoas sign. No other area of significant pain of the abdomen on exam. Labs unremarkable. UA negative for infection. CT negative for obvious findings to explain pain beyond mild constipation. Added fentanyl  for pain control and US  for  further evaluation of the ovaries. Will reassess shortly. Ultrasound added on for further assessment which was thankfully negative for any obvious findings such as torsion or concerning cyst or mass.  Based on this workup being negative, I suspect your symptoms may be secondary to strain of the psoas muscle but difficult to fully tell.  Advised patient to return to the emergency department for any concerns or worsening symptoms.  Made changes to her home medications including short substitution of her diclofenac  with Toradol  for the next week.   Social Determinants of Health:  None  Final diagnoses:  Right lower quadrant abdominal pain    ED Discharge Orders          Ordered    ketorolac  (TORADOL ) 10 MG tablet  Every 6 hours PRN        09/14/24 1916               Creig Landin A, PA-C 09/14/24 1918    Cottie Donnice PARAS, MD 09/14/24 1927  "

## 2024-09-14 NOTE — ED Notes (Signed)
 Patient is being discharged from the Urgent Care and sent to the Emergency Department via private vehicle . Per Rocky Mecum PA, patient is in need of higher level of care due to right lower abdominal pain. Patient is aware and verbalizes understanding of plan of care.   Vitals:   09/14/24 0858  BP: 126/85  Pulse: 77  Resp: 18  Temp: 97.9 F (36.6 C)  SpO2: 96%

## 2024-09-14 NOTE — ED Triage Notes (Signed)
 Pt presents with a chief complaint of RLQ pain. Sudden onset yesterday morning. Pain has gradually become worse. Rates as a 4/10 at this time. Pain increases with movement and with pressing on the area. Feels nauseous today. Denies fevers.

## 2024-10-09 ENCOUNTER — Ambulatory Visit: Admitting: Allergy & Immunology
